# Patient Record
Sex: Female | Born: 1950 | Race: White | Hispanic: No | Smoking: Current every day smoker
Health system: Southern US, Community
[De-identification: ages and names within clinical notes are randomized; demographics above are authoritative.]

## PROBLEM LIST (undated history)

## (undated) DIAGNOSIS — E7800 Pure hypercholesterolemia, unspecified: Secondary | ICD-10-CM

## (undated) DIAGNOSIS — J449 Chronic obstructive pulmonary disease, unspecified: Secondary | ICD-10-CM

## (undated) DIAGNOSIS — F329 Major depressive disorder, single episode, unspecified: Secondary | ICD-10-CM

## (undated) DIAGNOSIS — F32A Depression, unspecified: Secondary | ICD-10-CM

## (undated) DIAGNOSIS — F419 Anxiety disorder, unspecified: Secondary | ICD-10-CM

## (undated) DIAGNOSIS — R0602 Shortness of breath: Secondary | ICD-10-CM

## (undated) DIAGNOSIS — C4491 Basal cell carcinoma of skin, unspecified: Secondary | ICD-10-CM

## (undated) DIAGNOSIS — I10 Essential (primary) hypertension: Secondary | ICD-10-CM

## (undated) DIAGNOSIS — K219 Gastro-esophageal reflux disease without esophagitis: Secondary | ICD-10-CM

## (undated) HISTORY — PX: CHOLECYSTECTOMY: SHX55

## (undated) HISTORY — PX: TUBAL LIGATION: SHX77

## (undated) HISTORY — DX: Basal cell carcinoma of skin, unspecified: C44.91

## (undated) HISTORY — DX: Gastro-esophageal reflux disease without esophagitis: K21.9

## (undated) HISTORY — DX: Anxiety disorder, unspecified: F41.9

## (undated) HISTORY — PX: BACK SURGERY: SHX140

---

## 2009-07-25 HISTORY — PX: COLONOSCOPY: SHX174

## 2009-09-24 HISTORY — PX: OTHER SURGICAL HISTORY: SHX169

## 2012-04-11 ENCOUNTER — Emergency Department (HOSPITAL_COMMUNITY): Payer: Medicare PPO

## 2012-04-11 ENCOUNTER — Emergency Department (HOSPITAL_COMMUNITY)
Admission: EM | Admit: 2012-04-11 | Discharge: 2012-04-11 | Disposition: A | Discharge status: Home / Self Care | Payer: Medicare PPO | Attending: Emergency Medicine | Admitting: Emergency Medicine

## 2012-04-11 ENCOUNTER — Encounter (HOSPITAL_COMMUNITY): Payer: Self-pay

## 2012-04-11 DIAGNOSIS — F3289 Other specified depressive episodes: Secondary | ICD-10-CM | POA: Insufficient documentation

## 2012-04-11 DIAGNOSIS — R079 Chest pain, unspecified: Secondary | ICD-10-CM | POA: Insufficient documentation

## 2012-04-11 DIAGNOSIS — E7800 Pure hypercholesterolemia, unspecified: Secondary | ICD-10-CM | POA: Insufficient documentation

## 2012-04-11 DIAGNOSIS — R0602 Shortness of breath: Secondary | ICD-10-CM

## 2012-04-11 DIAGNOSIS — F17200 Nicotine dependence, unspecified, uncomplicated: Secondary | ICD-10-CM | POA: Insufficient documentation

## 2012-04-11 DIAGNOSIS — R05 Cough: Secondary | ICD-10-CM | POA: Insufficient documentation

## 2012-04-11 DIAGNOSIS — I10 Essential (primary) hypertension: Secondary | ICD-10-CM | POA: Insufficient documentation

## 2012-04-11 DIAGNOSIS — R059 Cough, unspecified: Secondary | ICD-10-CM | POA: Insufficient documentation

## 2012-04-11 DIAGNOSIS — F329 Major depressive disorder, single episode, unspecified: Secondary | ICD-10-CM | POA: Insufficient documentation

## 2012-04-11 HISTORY — DX: Major depressive disorder, single episode, unspecified: F32.9

## 2012-04-11 HISTORY — DX: Depression, unspecified: F32.A

## 2012-04-11 HISTORY — DX: Pure hypercholesterolemia, unspecified: E78.00

## 2012-04-11 HISTORY — DX: Essential (primary) hypertension: I10

## 2012-04-11 LAB — BASIC METABOLIC PANEL
BUN: 9 mg/dL (ref 6–23)
CO2: 23 mEq/L (ref 19–32)
Chloride: 105 mEq/L (ref 96–112)
GFR calc Af Amer: 75 mL/min — ABNORMAL LOW (ref >90)
Glucose, Bld: 79 mg/dL (ref 70–99)
Potassium: 4.4 mEq/L (ref 3.5–5.1)

## 2012-04-11 LAB — DIFFERENTIAL
Basophils Absolute: 0.1 K/uL (ref 0.0–0.1)
Basophils Relative: 1 % (ref 0–1)
Eosinophils Absolute: 0.2 K/uL (ref 0.0–0.7)
Eosinophils Relative: 2 % (ref 0–5)
Lymphocytes Relative: 13 % (ref 12–46)
Lymphs Abs: 0.8 K/uL (ref 0.7–4.0)
Monocytes Absolute: 0.5 K/uL (ref 0.1–1.0)
Monocytes Relative: 8 % (ref 3–12)
Neutro Abs: 4.8 K/uL (ref 1.7–7.7)
Neutrophils Relative %: 76 % (ref 43–77)

## 2012-04-11 LAB — CBC
HCT: 38.9 % (ref 36.0–46.0)
Hemoglobin: 12.1 g/dL (ref 12.0–15.0)
RBC: 4.4 MIL/uL (ref 3.87–5.11)

## 2012-04-11 LAB — TROPONIN I: Troponin I: <0.3 ng/mL

## 2012-04-11 MED ORDER — METHYLPREDNISOLONE SODIUM SUCC 125 MG IJ SOLR
125.0000 mg | Freq: Once | INTRAMUSCULAR | Status: AC
  Administered 2012-04-11: 125 mg via INTRAVENOUS
  Filled 2012-04-11: qty 2

## 2012-04-11 MED ORDER — ALBUTEROL SULFATE (5 MG/ML) 0.5% IN NEBU
2.5000 mg | INHALATION_SOLUTION | Freq: Once | RESPIRATORY_TRACT | Status: AC
  Administered 2012-04-11: 2.5 mg via RESPIRATORY_TRACT
  Filled 2012-04-11: qty 0.5

## 2012-04-11 MED ORDER — ALBUTEROL SULFATE HFA 108 (90 BASE) MCG/ACT IN AERS: 1.0000 | INHALATION_SPRAY | Freq: Four times a day (QID) | RESPIRATORY_TRACT | Status: DC | PRN

## 2012-04-11 MED ORDER — PREDNISONE 10 MG PO TABS: 20.0000 mg | ORAL_TABLET | Freq: Every day | ORAL | Status: AC

## 2012-04-11 NOTE — ED Notes (Signed)
Pt ambulated in hallway with portable pulse ox. O2 sats 95%-97% room air, hr-80's. Denies sob/cp/dizziness/n/v. Gait steady. Nad noted.

## 2012-04-11 NOTE — ED Notes (Signed)
Pt was given asa 324 mg asa and ntg 1, sl at PCP's office

## 2012-04-11 NOTE — Discharge Instructions (Signed)
Your blood work including your heart numbers were normal. Your EKG and chest xray were normal. The most likely cause of both your shortness of breath and chest pain is mild congestion in your lungs. Use the inahler 4 times a day for the next 4 days then as needed for wheezing. Take all of the prednisone. Follow up with your doctor.

## 2012-04-11 NOTE — ED Notes (Signed)
Pt went to her PCP today for eval. States while there she began experiencing chest pain. States she had been having chest pain off and on for a week. Denies pain at present.

## 2012-04-11 NOTE — ED Provider Notes (Addendum)
History     CSN: [id number]  Arrival date & time 04/11/12  1237   First MD Initiated Contact with Patient 04/11/12 1305      Chief Complaint  Patient presents with  . Chest Pain    (Consider location/radiation/quality/duration/timing/severity/associated sxs/prior treatment) HPI Cynthia Munoz is a 61 y.o. female who presents to the Emergency Department complaining of shortness of breath associated with chest pain for one week. It is worse with ambulation. Denies fever, chills, nausea vomiting, diarrhea. Denies extremity swelling.    Past Medical History  Diagnosis Date  . Hypertension   . High cholesterol   . Depression     Past Surgical History  Procedure Date  . Cholecystectomy     No family history on file.  History  Substance Use Topics  . Smoking status: Current Everyday Smoker  . Smokeless tobacco: Not on file  . Alcohol Use: No    OB History    Grav Para Term Preterm Abortions TAB SAB Ect Mult Living                  Review of Systems  Constitutional: Negative for fever.       10 Systems reviewed and are negative for acute change except as noted in the HPI.  HENT: Negative for congestion.   Eyes: Negative for discharge and redness.  Respiratory: Positive for cough and shortness of breath.   Cardiovascular: Positive for chest pain.  Gastrointestinal: Negative for vomiting and abdominal pain.  Musculoskeletal: Negative for back pain.  Skin: Negative for rash.  Neurological: Negative for syncope, numbness and headaches.  Psychiatric/Behavioral:       No behavior change.   . Allergies  Review of patient's allergies indicates no known allergies.  Home Medications   Current Outpatient Rx  Name Route Sig Dispense Refill  . ALBUTEROL SULFATE HFA 108 (90 BASE) MCG/ACT IN AERS Inhalation Inhale 2 puffs into the lungs every 6 (six) hours as needed. Shortness of breath    . ASPIRIN EC 81 MG PO TBEC Oral Take 81 mg by mouth daily.    . FENOFIBRATE 145  MG PO TABS Oral Take 145 mg by mouth at bedtime.    Marland Kitchen FLUTICASONE PROPIONATE 50 MCG/ACT NA SUSP Nasal Place 1 spray into the nose daily.    Marland Kitchen LISINOPRIL 20 MG PO TABS Oral Take 40 mg by mouth daily.    Marland Kitchen NIACIN 500 MG PO TABS Oral Take 500 mg by mouth 2 (two) times daily.    Marland Kitchen OMEPRAZOLE 20 MG PO CPDR Oral Take 20 mg by mouth daily.    Marland Kitchen POLYETHYLENE GLYCOL 3350 PO POWD Oral Take 17 g by mouth daily.    Marland Kitchen PRAVASTATIN SODIUM 40 MG PO TABS Oral Take 80 mg by mouth at bedtime.    . SERTRALINE HCL 100 MG PO TABS Oral Take 200 mg by mouth daily.    Marland Kitchen ZOLPIDEM TARTRATE 5 MG PO TABS Oral Take 5 mg by mouth at bedtime.      BP 151/65  Pulse 81  Temp(Src) 98.5 F (36.9 C) (Oral)  Resp 20  SpO2 97%  Physical Exam  Nursing note and vitals reviewed. Constitutional:       Awake, alert, nontoxic appearance.  HENT:  Head: Atraumatic.  Eyes: Right eye exhibits no discharge. Left eye exhibits no discharge.  Neck: Neck supple.  Cardiovascular: Normal rate, regular rhythm, normal heart sounds and intact distal pulses.   Pulmonary/Chest: Effort normal. She exhibits no tenderness.  Abdominal: Soft. There is no tenderness. There is no rebound.  Musculoskeletal: She exhibits no edema and no tenderness.       Baseline ROM, no obvious new focal weakness.  Neurological:       Mental status and motor strength appears baseline for patient and situation.  Skin: No rash noted.  Psychiatric: She has a normal mood and affect.    ED Course  Procedures (including critical care time)  Results for orders placed during the hospital encounter of 04/11/12  CBC      Component Value Range   WBC 6.3  4.0 - 10.5 (K/uL)   RBC 4.40  3.87 - 5.11 (MIL/uL)   Hemoglobin 12.1  12.0 - 15.0 (g/dL)   HCT 78.2  95.6 - 21.3 (%)   MCV 88.4  78.0 - 100.0 (fL)   MCH 27.5  26.0 - 34.0 (pg)   MCHC 31.1  30.0 - 36.0 (g/dL)   RDW 08.6  57.8 - 46.9 (%)   Platelets 163  150 - 400 (K/uL)  DIFFERENTIAL      Component Value Range    Neutrophils Relative 76  43 - 77 (%)   Neutro Abs 4.8  1.7 - 7.7 (K/uL)   Lymphocytes Relative 13  12 - 46 (%)   Lymphs Abs 0.8  0.7 - 4.0 (K/uL)   Monocytes Relative 8  3 - 12 (%)   Monocytes Absolute 0.5  0.1 - 1.0 (K/uL)   Eosinophils Relative 2  0 - 5 (%)   Eosinophils Absolute 0.2  0.0 - 0.7 (K/uL)   Basophils Relative 1  0 - 1 (%)   Basophils Absolute 0.1  0.0 - 0.1 (K/uL)  BASIC METABOLIC PANEL      Component Value Range   Sodium 138  135 - 145 (mEq/L)   Potassium 4.4  3.5 - 5.1 (mEq/L)   Chloride 105  96 - 112 (mEq/L)   CO2 23  19 - 32 (mEq/L)   Glucose, Bld 79  70 - 99 (mg/dL)   BUN 9  6 - 23 (mg/dL)   Creatinine, Ser 6.29  0.50 - 1.10 (mg/dL)   Calcium 52.8 (*) 8.4 - 10.5 (mg/dL)   GFR calc non Af Amer 64 (*) >90 (mL/min)   GFR calc Af Amer 75 (*) >90 (mL/min)  TROPONIN I      Component Value Range   Troponin I <0.30  <0.30 (ng/mL)   Dg Chest Portable 1 View  04/11/2012  *RADIOLOGY REPORT*  Clinical Data: Chest pain and shortness of breath.  PORTABLE CHEST - 1 VIEW  Comparison: 09/09/2008.  Findings: Trachea is midline.  Heart size normal.  Linear densities at the lung bases are accentuated by somewhat low lung volumes and were present on 09/09/2008, indicating scarring.  Lungs are otherwise clear.  No pleural fluid.  IMPRESSION: No acute findings.  Original Report Authenticated By: Reyes Ivan, M.D.    Date: 04/11/2012  1254  Rate: 74  Rhythm: normal sinus rhythm  QRS Axis: normal  Intervals: normal  ST/T Wave abnormalities: normal  Conduction Disutrbances:none  Narrative Interpretation:   Old EKG Reviewed: none available     MDM  Patient with SOB and chest pain x 1 week. Received nebulizer treatment with improvement. Labs unremarkable. Troponin and EKG normal. Chest xray normal. Patient ambulated in the hall with O2 sats remaining between 95 and 90% and in no distress. She is taking by mouth fluids, have a snack. Review of laboratory data with the  patient and her family.Pt stable in ED with no significant deterioration in condition.The patient appears reasonably screened and/or stabilized for discharge and I doubt any other medical condition or other [facility] requiring further screening, evaluation, or treatment in the ED at this time prior to discharge.  MDM Reviewed: nursing note and vitals Interpretation: labs, ECG and x-ray           Nicoletta Dress. Colon Branch, MD 04/11/12 1533  Nicoletta Dress. Colon Branch, MD 04/11/12 [id number]

## 2012-07-25 HISTORY — PX: ESOPHAGOGASTRODUODENOSCOPY: SHX1529

## 2013-06-04 ENCOUNTER — Encounter: Payer: Self-pay | Admitting: Gastroenterology

## 2013-06-04 ENCOUNTER — Ambulatory Visit (INDEPENDENT_AMBULATORY_CARE_PROVIDER_SITE_OTHER): Payer: Medicare PPO | Admitting: Gastroenterology

## 2013-06-04 VITALS — BP 145/74 | HR 87 | Temp 98.0°F | Ht 63.0 in | Wt 142.8 lb

## 2013-06-04 DIAGNOSIS — R1011 Right upper quadrant pain: Secondary | ICD-10-CM

## 2013-06-04 DIAGNOSIS — R945 Abnormal results of liver function studies: Secondary | ICD-10-CM | POA: Insufficient documentation

## 2013-06-04 DIAGNOSIS — R1013 Epigastric pain: Secondary | ICD-10-CM | POA: Insufficient documentation

## 2013-06-04 DIAGNOSIS — R7989 Other specified abnormal findings of blood chemistry: Secondary | ICD-10-CM

## 2013-06-04 DIAGNOSIS — D509 Iron deficiency anemia, unspecified: Secondary | ICD-10-CM

## 2013-06-04 NOTE — Progress Notes (Signed)
Primary Care Physician:  Alleen Borne  Primary Gastroenterologist:  Jonette Eva, MD   Chief Complaint  Patient presents with  . Abdominal Pain    HPI:  Cynthia Munoz is a 62 y.o. female here for further evaluation of RUQ/epigastric pain. Symptoms have been present for over one month. She has h/o EGD last year with ulcers noted by Dr. Jennette Dubin in [place]. Symptoms may last couple of days. Not worse with movement or sitting. Worse with eating. Episode Sunday after Meatloaf. Some vomiting, no hematemesis. No heartburn. No dysphagia. BM regular. No melena, brbpr. Weak and tired. Takes Voltaren chronically. No OTC NSAIDs. Labs as below. Patient reports last TCS by Dr. Aleene Davidson few years ago. C/O 8 pound weight loss.  Current Outpatient Prescriptions  Medication Sig Dispense Refill  . albuterol (PROVENTIL HFA;VENTOLIN HFA) 108 (90 BASE) MCG/ACT inhaler Inhale 2 puffs into the lungs every 6 (six) hours as needed. Shortness of breath      . aspirin EC 81 MG tablet Take 81 mg by mouth daily.      . diclofenac (VOLTAREN) 75 MG EC tablet Take 75 mg by mouth 2 (two) times daily.       . fenofibrate (TRICOR) 145 MG tablet Take 145 mg by mouth daily.       . fluticasone (FLONASE) 50 MCG/ACT nasal spray Place 1 spray into the nose daily.      Marland Kitchen lisinopril (PRINIVIL,ZESTRIL) 20 MG tablet Take 40 mg by mouth daily.      . niacin 500 MG tablet Take 500 mg by mouth 2 (two) times daily.      . pantoprazole (PROTONIX) 40 MG tablet Take 40 mg by mouth daily.       . pravastatin (PRAVACHOL) 40 MG tablet Take 80 mg by mouth at bedtime.      . sertraline (ZOLOFT) 100 MG tablet Take 200 mg by mouth daily.       No current facility-administered medications for this visit.    Allergies as of 06/04/2013  . (No Known Allergies)    Past Medical History  Diagnosis Date  . Hypertension   . High cholesterol   . Depression   . Anxiety   . Skin cancer, basal cell   . GERD (gastroesophageal reflux  disease)     Past Surgical History  Procedure Laterality Date  . Cholecystectomy    . Back surgery      Family History  Problem Relation Age of Onset  . Colon cancer Paternal Aunt   . Lung cancer Father   . Heart disease Father     History   Social History  . Marital Status: Legally Separated    Spouse Name: N/A    Number of Children: N/A  . Years of Education: N/A   Occupational History  . Not on file.   Social History Main Topics  . Smoking status: Current Every Day Smoker -- 1.50 packs/day    Types: Cigarettes  . Smokeless tobacco: Not on file  . Alcohol Use: No  . Drug Use: No  . Sexually Active: Not on file   Other Topics Concern  . Not on file   Social History Narrative  . No narrative on file      ROS:  General: Negative for anorexia, weight loss, fever, chills. + fatigue, weakness. Eyes: Negative for vision changes.  ENT: Negative for hoarseness, difficulty swallowing , nasal congestion. CV: Negative for chest pain, angina, palpitations, dyspnea on exertion, peripheral edema.  Respiratory:  Negative for dyspnea at rest, dyspnea on exertion, cough, sputum, wheezing.  GI: See history of present illness. GU:  Negative for dysuria, hematuria, urinary incontinence, urinary frequency, nocturnal urination.  MS: + for joint pain, low back pain.  Derm: Negative for rash or itching.  Neuro: Negative for weakness, abnormal sensation, seizure, frequent headaches, memory loss, confusion.  Psych: Negative for anxiety, depression, suicidal ideation, hallucinations.  Endo: Negative for unusual weight change.  Heme: Negative for bruising or bleeding. Allergy: Negative for rash or hives.    Physical Examination:  BP 145/74  Pulse 87  Temp(Src) 98 F (36.7 C) (Oral)  Ht 5\' 3"  (1.6 m)  Wt 142 lb 12.8 oz (64.774 kg)  BMI 25.3 kg/m2   General: Well-nourished, well-developed in no acute distress. Accompanied by daughter.  Head: Normocephalic, atraumatic.    Eyes: Conjunctiva pink, no icterus. Mouth: Oropharyngeal mucosa moist and pink , no lesions erythema or exudate. Neck: Supple without thyromegaly, masses, or lymphadenopathy.  Lungs: Clear to auscultation bilaterally.  Heart: Regular rate and rhythm, no murmurs rubs or gallops.  Abdomen: Bowel sounds are normal, epigastric/ruq tenderness with deep palpation. Tender along right lower ribcage. No cva tenderness or rash. Abd nondistended, no hepatosplenomegaly or masses, no abdominal bruits or    hernia , no rebound or guarding.   Rectal: not performed Extremities: No lower extremity edema. No clubbing or deformities.  Neuro: Alert and oriented x 4 , grossly normal neurologically.  Skin: Warm and dry, no rash or jaundice.   Psych: Alert and cooperative, normal mood and affect.  Labs: Labs from 05/30/2013  White blood cell count 7800, hemoglobin 11.1, hematocrit 34.2, MCV 73, platelet count 225,000, sodium 141, potassium 4.1, calcium-ionized 5.9, glucose 87, BUN 12, creatinine 0.9, H. pylori antibody negative, total bilirubin 0.4, alkaline phosphatase 112, AST 46, ALT 14, albumin 3.5.  Imaging Studies: No results found.

## 2013-06-04 NOTE — Patient Instructions (Addendum)
1. Once we receive your records from your prior upper endoscopy and colonoscopy, we will make further recommendations. 2. Continue pantoprazole for now. 3. Please have your blood work done in one month. 4. Please collect stool specimen and return to our office. 5. Please follow diet below given your history of ulcers and ongoing abdominal pain.  Diet for Peptic Ulcer Disease Peptic ulcer disease is a term used to describe open sores in the stomach and digestive tract. These sores can be painful, and the pain can be worse when the stomach is empty. These ulcers can lead to bleeding in the esophagus, stomach, and intestines (gastrointestinaltract). Nutrition therapy can help ease the discomfort of peptic ulcer disease.   GENERAL DIET GUIDELINES FOR PEPTIC ULCER DISEASE  Your diet should be rich in fruits, vegetables, and whole grains. It should also be low in fat.   Avoid foods that can irritate your sores.   Avoid foods that are not tolerated or foods that cause pain. This may be different for different people. FOODS AND DRINKS TO AVOID  High-fat dairy and milk products including whole milk, cream, chocolate milk, butter, sour cream, or cream cheese.   High-fat meats and poultry including hot dogs, fried meats or poultry, meats with skin, sausage, or bacon.   Pepper.  Alcohol.  Chocolate.  Tea, coffee, and cola (regular and caffeine).  Lard or hydrogenated oils such as stick margarine.  SAMPLE MEAL PLAN This meal plan is approximately 2000 calories based on https://www.[facility].org/ meal planning guidelines.  Breakfast   cup cooked oatmeal.  1 cup strawberries.  1 cup low-fat milk.  1 oz almonds. Snack  1 cup cucumber slices.  6 oz yogurt (made from low-fat or fat-free milk). Lunch  2 slices whole-wheat bread.  2 oz sliced [place].  2 tsp mayonnaise.  1 cup blueberries.  1 cup snap peas. Snack  6 whole-wheat crackers.  1 oz string cheese. Dinner   cup  brown rice.  1 cup mixed veggies.  1 tsp olive oil.  3 oz grilled fish. Document Released: 03/04/2012 Document Reviewed: 03/04/2012 [facility] Patient Information 2014 [place], [place].

## 2013-06-05 NOTE — Assessment & Plan Note (Signed)
Minimal elevation of AST. Repeat in one month. If remains elevated, consider abd u/s.

## 2013-06-05 NOTE — Assessment & Plan Note (Signed)
62 y/o female with prior h/o PUD last year, chronic NSAID use who presents with one month h/o epig/ruq pain worse with meals. She has mild microcytic anemia possibly from occult GI blood loss. Prior EGD/TCS as above. Records have been requested. Plan for CBC, iron, ferritin, LFTs in one month. Check ifobt now. Likely EGD in near future.

## 2013-06-05 NOTE — Assessment & Plan Note (Signed)
CBC, iron, ferritin in one month. Ifobt. Obtain copy of last TCS.

## 2013-06-06 NOTE — Progress Notes (Signed)
Cc PCP 

## 2013-06-09 ENCOUNTER — Ambulatory Visit (INDEPENDENT_AMBULATORY_CARE_PROVIDER_SITE_OTHER): Payer: Medicare PPO | Admitting: Gastroenterology

## 2013-06-09 DIAGNOSIS — R1013 Epigastric pain: Secondary | ICD-10-CM

## 2013-06-09 LAB — CHG HEPATOBILIARY IMAGING
ALT: 14 U/L (ref 7–35)
AST: 46 U/L
Creat: 0.9
Potassium: 4.1 mmol/L

## 2013-06-09 LAB — IFOBT (OCCULT BLOOD): IFOBT: POSITIVE

## 2013-06-09 LAB — H. PYLORI ANTIGEN, STOOL: HELICOBACTER PYLORI AB, IGA: NEGATIVE

## 2013-06-16 NOTE — Progress Notes (Signed)
Quick Note:  Heme positive. Did we received records of prior EGD/TCS yet? ______

## 2013-06-16 NOTE — Progress Notes (Signed)
Quick Note:  Yes. Reports in your office. ______

## 2013-07-03 ENCOUNTER — Encounter: Payer: Self-pay | Admitting: Gastroenterology

## 2013-07-03 ENCOUNTER — Telehealth: Payer: Self-pay | Admitting: Gastroenterology

## 2013-07-03 NOTE — Telephone Encounter (Signed)
Please let patient know that I have reviewed her previous endoscopy records. Past surgical history has been updated.  Given her history of gastric ulcers, Hemoccult-positive stool, iron deficiency anemia she will definitely need an upper endoscopy. We will need to decide about colonoscopy.  At this time, I would advise her to have her repeat blood work as discussed at time of her last office visit.  Schedule office visit one to 2 days following the labs, at that time we can schedule EGD plus or minus colonoscopy as appropriate.

## 2013-07-04 NOTE — Telephone Encounter (Signed)
Routing to DS, this is a [facility] pt. 

## 2013-07-09 NOTE — Telephone Encounter (Signed)
Called and informed pt. She is scheduled OV with Tana Coast, PA on 07/29/2013 at 9:00 AM. She has lab orders and will do the labs.

## 2013-07-25 LAB — CBC WITH DIFFERENTIAL/PLATELET
Basophils Absolute: 0.1 10*3/uL (ref 0.0–0.1)
HCT: 38.1 % (ref 36.0–46.0)
Lymphocytes Relative: 13 % (ref 12–46)
Monocytes Absolute: 0.5 10*3/uL (ref 0.1–1.0)
Neutro Abs: 5.4 10*3/uL (ref 1.7–7.7)
RDW: 16.9 % — ABNORMAL HIGH (ref 11.5–15.5)
WBC: 7.2 10*3/uL (ref 4.0–10.5)

## 2013-07-25 LAB — HEPATIC FUNCTION PANEL
ALT: 11 U/L (ref 0–35)
AST: 28 U/L (ref 0–37)
Alkaline Phosphatase: 79 U/L (ref 39–117)
Bilirubin, Direct: 0.1 mg/dL (ref 0.0–0.3)
Indirect Bilirubin: 0.2 mg/dL (ref 0.0–0.9)

## 2013-07-25 LAB — IRON AND TIBC: UIBC: >575 ug/dL — ABNORMAL HIGH (ref 125–400)

## 2013-07-25 LAB — FERRITIN: Ferritin: 12 ng/mL (ref 10–291)

## 2013-07-28 NOTE — Progress Notes (Signed)
Quick Note:  LFTs normal. IDA.  OV tomorrow as planned. ______

## 2013-07-28 NOTE — Progress Notes (Signed)
Quick Note:  Called and informed pt. ______ 

## 2013-07-29 ENCOUNTER — Ambulatory Visit: Payer: Medicare PPO | Admitting: Gastroenterology

## 2013-07-29 ENCOUNTER — Encounter: Payer: Self-pay | Admitting: Gastroenterology

## 2013-07-29 ENCOUNTER — Ambulatory Visit (INDEPENDENT_AMBULATORY_CARE_PROVIDER_SITE_OTHER): Payer: Medicare PPO | Admitting: Gastroenterology

## 2013-07-29 VITALS — BP 145/87 | HR 87 | Temp 99.5°F | Ht 63.0 in | Wt 141.8 lb

## 2013-07-29 DIAGNOSIS — D509 Iron deficiency anemia, unspecified: Secondary | ICD-10-CM

## 2013-07-29 DIAGNOSIS — R1011 Right upper quadrant pain: Secondary | ICD-10-CM

## 2013-07-29 MED ORDER — PEG 3350-KCL-NA BICARB-NACL 420 G PO SOLR: 4000.0000 mL | ORAL | Status: DC

## 2013-07-29 MED ORDER — PANTOPRAZOLE SODIUM 40 MG PO TBEC: 40.0000 mg | DELAYED_RELEASE_TABLET | Freq: Two times a day (BID) | ORAL | Status: DC

## 2013-07-29 NOTE — Assessment & Plan Note (Signed)
RUQ pain and epigastric pain in the setting of known PUD. LFTs normal. However, with weight loss, consider CT scan if EGD unrevealing.

## 2013-07-29 NOTE — Patient Instructions (Addendum)
We have scheduled you for a colonoscopy and an upper endoscopy with Dr. Darrick Penna in the near future.  STOP ALL BC POWDERS. This causes ulcers and delays ulcer healing in your stomach.  I have increased your Protonix to twice a day.

## 2013-07-29 NOTE — Assessment & Plan Note (Signed)
62 year old female with history of PUD, noted on EGD in [place] Aug 2013, now with IDA and heme positive stool. Persistent epigastric and RUQ pain in the setting of Voltaren BID and BC powders daily. Likely culprit of persistent discomfort. LFTs normal. Vague early satiety with associated weight loss of at least 10 lbs. Last colonoscopy in 2010 with hyperplastic polyp by Dr. Aleene Davidson.   Needs TCS/EGD due to IDA with Dr. Darrick Penna. I discussed the risks and benefits in detail with patient, and she states understanding. Increase Protonix to BID. AVOID ALL ASPIRIN POWDERS.

## 2013-07-29 NOTE — Progress Notes (Signed)
Referring Provider: Rush Barer, PA-C Primary Care Physician:  Delorse Lek, FNP Primary GI: Dr. Darrick Penna   Chief Complaint  Patient presents with  . Follow-up  . EGD    HPI:   62 year old Munoz returns today in follow-up to discuss EGD and possible colonoscopy. Originally seen in June 2014 by our practice for epigastric and RUQ pain. History of PUD, documented by Dr. Aileen Fass in [place], [place] in Aug 2013. Likely secondary to chronic NSAIDs.  Surveillance EGD not performed. Last colonoscopy by Dr. Aleene Davidson in 2010 with hyperplastic polyp. Heme positive at last visit. IDA noted.   Notes RUQ discomfort, radiating to epigastric region. Worse with eating. Sometimes feels like she is going to choke while eating. Reports early satiety. Notes her baseline weight is 153, now 141. Voltaren BID. BC powders one a day. No melena. No bright red blood. Some constipation, alternating with diarrhea. Chronic history of constipation. Protonix daily.    Past Medical History  Diagnosis Date  . Hypertension   . High cholesterol   . Depression   . Anxiety   . Skin cancer, basal cell   . GERD (gastroesophageal reflux disease)     Past Surgical History  Procedure Laterality Date  . Cholecystectomy    . Back surgery    . Esophagogastroduodenoscopy  August 2013    Dr. Anette Riedel Hou--> protocol.c reflux esophagitis. Nonbleeding gastric ulcers. Hiatal hernia. Biopsies negative for H. pylori or Barrett's esophagus. Followup EGD recommended but not done.  . Colonoscopy  August 2010    Dr. Ree Kida Spainhour--> sessile polyp removed from the rectum into from the sigmoid colon. Pathology: Hyperplastic   . Small bowel capsule endoscopy  October 2010    Dr. Ree Kida Spainhour--> normal.     Current Outpatient Prescriptions  Medication Sig Dispense Refill  . albuterol (PROVENTIL HFA;VENTOLIN HFA) 108 (90 BASE) MCG/ACT inhaler Inhale 2 puffs into the lungs every 6 (six) hours as needed. Shortness of breath      . aspirin EC  81 MG tablet Take 81 mg by mouth daily.      . diclofenac (VOLTAREN) 75 MG EC tablet Take 75 mg by mouth 2 (two) times daily.       . fenofibrate (TRICOR) 145 MG tablet Take 145 mg by mouth daily.       . fluticasone (FLONASE) 50 MCG/ACT nasal spray Place 1 spray into the nose daily.      Marland Kitchen lisinopril (PRINIVIL,ZESTRIL) 20 MG tablet Take 40 mg by mouth daily.      . niacin 500 MG tablet Take 500 mg by mouth 2 (two) times daily.      . pantoprazole (PROTONIX) 40 MG tablet Take 1 tablet (40 mg total) by mouth 2 (two) times daily.  60 tablet  3  . pravastatin (PRAVACHOL) 40 MG tablet Take 80 mg by mouth at bedtime.      . sertraline (ZOLOFT) 100 MG tablet Take 200 mg by mouth daily.       No current facility-administered medications for this visit.    Allergies as of 07/29/2013  . (No Known Allergies)    Family History  Problem Relation Age of Onset  . Colon cancer Paternal Aunt     greater >60  . Lung cancer Father   . Heart disease Father   . Liver disease Neg Hx   . Celiac disease Neg Hx   . Diverticulitis Sister     History   Social History  . Marital Status: Legally Separated  Spouse Name: N/A    Number of Children: 2  . Years of Education: N/A   Social History Main Topics  . Smoking status: Current Every Day Smoker -- 1.50 packs/day    Types: Cigarettes  . Smokeless tobacco: None  . Alcohol Use: No  . Drug Use: No  . Sexually Active: None   Other Topics Concern  . None   Social History Narrative  . None    Review of Systems: Negative unless mentioned in HPI.  Physical Exam: BP 145/87  Pulse 87  Temp(Src) 99.5 F (37.5 C) (Oral)  Ht 5\' 3"  (1.6 m)  Wt 141 lb Cynthia.8 oz (64.32 kg)  BMI 25.13 kg/m2 General:   Alert and oriented. No distress noted. Pleasant and cooperative.  Head:  Normocephalic and atraumatic. Eyes:  Conjuctiva clear without scleral icterus. Mouth:  Oral mucosa pink and moist.  Heart:  S1, S2 present without murmurs, rubs, or gallops.  Regular rate and rhythm. Lungs: mild wheeze bilaterally Abdomen:  +BS, soft, non-tender epigastric region, mild TTP with deep palpation RUQ. non-distended. No rebound or guarding. No HSM or masses noted. Msk:  Symmetrical without gross deformities. Normal posture. Extremities:  Without edema. Neurologic:  Alert and  oriented x4;  grossly normal neurologically. Skin:  Intact without significant lesions or rashes. Psych:  Alert and cooperative. Normal mood and affect.  Lab Results  Component Value Date   WBC 7.2 07/24/2013   HGB 11.6* 07/24/2013   HCT 38.1 07/24/2013   MCV 72.4* 07/24/2013   PLT 211 07/24/2013   Lab Results  Component Value Date   IRON 17* 07/24/2013   TIBC NOT CALC 07/24/2013   FERRITIN Cynthia 07/24/2013   Lab Results  Component Value Date   ALT 11 07/24/2013   AST 28 07/24/2013   ALKPHOS 79 07/24/2013   BILITOT 0.3 07/24/2013

## 2013-07-30 ENCOUNTER — Encounter (HOSPITAL_COMMUNITY): Payer: Self-pay | Admitting: Pharmacy Technician

## 2013-08-04 NOTE — Progress Notes (Signed)
CC'd to PCP 

## 2013-08-11 ENCOUNTER — Encounter (HOSPITAL_COMMUNITY)
Admission: RE | Disposition: A | Discharge status: Home / Self Care | Payer: Self-pay | Source: Ambulatory Visit | Attending: Gastroenterology

## 2013-08-11 ENCOUNTER — Encounter (HOSPITAL_COMMUNITY): Payer: Self-pay | Admitting: *Deleted

## 2013-08-11 ENCOUNTER — Ambulatory Visit (HOSPITAL_COMMUNITY)
Admission: RE | Admit: 2013-08-11 | Discharge: 2013-08-11 | Disposition: A | Discharge status: Home / Self Care | Payer: Medicare PPO | Source: Ambulatory Visit | Attending: Gastroenterology | Admitting: Gastroenterology

## 2013-08-11 DIAGNOSIS — K5730 Diverticulosis of large intestine without perforation or abscess without bleeding: Secondary | ICD-10-CM | POA: Insufficient documentation

## 2013-08-11 DIAGNOSIS — R1011 Right upper quadrant pain: Secondary | ICD-10-CM

## 2013-08-11 DIAGNOSIS — D509 Iron deficiency anemia, unspecified: Secondary | ICD-10-CM

## 2013-08-11 DIAGNOSIS — F411 Generalized anxiety disorder: Secondary | ICD-10-CM | POA: Insufficient documentation

## 2013-08-11 DIAGNOSIS — Z791 Long term (current) use of non-steroidal anti-inflammatories (NSAID): Secondary | ICD-10-CM | POA: Insufficient documentation

## 2013-08-11 DIAGNOSIS — F17200 Nicotine dependence, unspecified, uncomplicated: Secondary | ICD-10-CM | POA: Insufficient documentation

## 2013-08-11 DIAGNOSIS — K648 Other hemorrhoids: Secondary | ICD-10-CM | POA: Insufficient documentation

## 2013-08-11 DIAGNOSIS — R634 Abnormal weight loss: Secondary | ICD-10-CM | POA: Insufficient documentation

## 2013-08-11 DIAGNOSIS — R197 Diarrhea, unspecified: Secondary | ICD-10-CM

## 2013-08-11 DIAGNOSIS — I10 Essential (primary) hypertension: Secondary | ICD-10-CM | POA: Insufficient documentation

## 2013-08-11 DIAGNOSIS — Z85828 Personal history of other malignant neoplasm of skin: Secondary | ICD-10-CM | POA: Insufficient documentation

## 2013-08-11 DIAGNOSIS — Z7982 Long term (current) use of aspirin: Secondary | ICD-10-CM | POA: Insufficient documentation

## 2013-08-11 DIAGNOSIS — Z79899 Other long term (current) drug therapy: Secondary | ICD-10-CM | POA: Insufficient documentation

## 2013-08-11 DIAGNOSIS — D649 Anemia, unspecified: Secondary | ICD-10-CM

## 2013-08-11 DIAGNOSIS — K259 Gastric ulcer, unspecified as acute or chronic, without hemorrhage or perforation: Secondary | ICD-10-CM | POA: Insufficient documentation

## 2013-08-11 DIAGNOSIS — E7800 Pure hypercholesterolemia, unspecified: Secondary | ICD-10-CM | POA: Insufficient documentation

## 2013-08-11 DIAGNOSIS — K219 Gastro-esophageal reflux disease without esophagitis: Secondary | ICD-10-CM | POA: Insufficient documentation

## 2013-08-11 DIAGNOSIS — Z8711 Personal history of peptic ulcer disease: Secondary | ICD-10-CM | POA: Insufficient documentation

## 2013-08-11 HISTORY — PX: COLONOSCOPY WITH ESOPHAGOGASTRODUODENOSCOPY (EGD): SHX5779

## 2013-08-11 SURGERY — COLONOSCOPY WITH ESOPHAGOGASTRODUODENOSCOPY (EGD)
Anesthesia: Moderate Sedation

## 2013-08-11 MED ORDER — MEPERIDINE HCL 100 MG/ML IJ SOLN
INTRAMUSCULAR | Status: AC
  Filled 2013-08-11: qty 2

## 2013-08-11 MED ORDER — STERILE WATER FOR IRRIGATION IR SOLN
Status: DC | PRN
  Administered 2013-08-11: 12:00:00

## 2013-08-11 MED ORDER — BUTAMBEN-TETRACAINE-BENZOCAINE 2-2-14 % EX AERO
INHALATION_SPRAY | CUTANEOUS | Status: DC | PRN
  Administered 2013-08-11: 1 via TOPICAL

## 2013-08-11 MED ORDER — SODIUM CHLORIDE 0.9 % IV SOLN
INTRAVENOUS | Status: DC
  Administered 2013-08-11: 11:00:00 via INTRAVENOUS

## 2013-08-11 MED ORDER — MEPERIDINE HCL 100 MG/ML IJ SOLN
INTRAMUSCULAR | Status: DC | PRN
  Administered 2013-08-11: 50 mg via INTRAVENOUS
  Administered 2013-08-11: 25 mg via INTRAVENOUS

## 2013-08-11 MED ORDER — PROMETHAZINE HCL 25 MG/ML IJ SOLN
INTRAMUSCULAR | Status: AC
  Filled 2013-08-11: qty 1

## 2013-08-11 MED ORDER — SODIUM CHLORIDE 0.9 % IJ SOLN
INTRAMUSCULAR | Status: AC
  Filled 2013-08-11: qty 10

## 2013-08-11 MED ORDER — MIDAZOLAM HCL 5 MG/5ML IJ SOLN
INTRAMUSCULAR | Status: AC
  Filled 2013-08-11: qty 10

## 2013-08-11 MED ORDER — ONDANSETRON HCL 4 MG/2ML IJ SOLN: INTRAMUSCULAR | Status: DC | PRN

## 2013-08-11 MED ORDER — PROMETHAZINE HCL 25 MG/ML IJ SOLN
12.5000 mg | Freq: Once | INTRAMUSCULAR | Status: AC
  Administered 2013-08-11: 12.5 mg via INTRAVENOUS

## 2013-08-11 MED ORDER — MIDAZOLAM HCL 5 MG/5ML IJ SOLN
INTRAMUSCULAR | Status: DC | PRN
  Administered 2013-08-11 (×3): 2 mg via INTRAVENOUS

## 2013-08-11 NOTE — H&P (Signed)
Primary Care Physician:  Delorse Lek, FNP Primary Gastroenterologist:  Dr. Darrick Penna  Pre-Procedure History & Physical: HPI:  Cynthia Munoz is a 62 y.o. female here for Anemia/weight loss/PUD AUG 2013.  Past Medical History  Diagnosis Date  . Hypertension   . High cholesterol   . Depression   . Anxiety   . Skin cancer, basal cell   . GERD (gastroesophageal reflux disease)     Past Surgical History  Procedure Laterality Date  . Cholecystectomy    . Back surgery    . Esophagogastroduodenoscopy  August 2013    Dr. Anette Riedel Hou--> protocol.c reflux esophagitis. Nonbleeding gastric ulcers. Hiatal hernia. Biopsies negative for H. pylori or Barrett's esophagus. Followup EGD recommended but not done.  . Colonoscopy  August 2010    Dr. Ree Kida Spainhour--> sessile polyp removed from the rectum into from the sigmoid colon. Pathology: Hyperplastic   . Small bowel capsule endoscopy  October 2010    Dr. Ree Kida Spainhour--> normal.     Prior to Admission medications   Medication Sig Start Date End Date Taking? Authorizing Provider  albuterol (PROVENTIL HFA;VENTOLIN HFA) 108 (90 BASE) MCG/ACT inhaler Inhale 2 puffs into the lungs every 6 (six) hours as needed. Shortness of breath   Yes Historical Provider, MD  aspirin EC 81 MG tablet Take 81 mg by mouth daily.   Yes Historical Provider, MD  diclofenac (VOLTAREN) 75 MG EC tablet Take 75 mg by mouth 2 (two) times daily.  05/30/13  Yes Historical Provider, MD  fenofibrate (TRICOR) 145 MG tablet Take 145 mg by mouth daily.    Yes Historical Provider, MD  lisinopril (PRINIVIL,ZESTRIL) 20 MG tablet Take 40 mg by mouth daily.   Yes Historical Provider, MD  NASONEX 50 MCG/ACT nasal spray Place 2 sprays into the nose daily. 06/05/13  Yes Historical Provider, MD  niacin 500 MG tablet Take 500 mg by mouth 2 (two) times daily.   Yes Historical Provider, MD  pantoprazole (PROTONIX) 40 MG tablet Take 1 tablet (40 mg total) by mouth 2 (two) times daily. 07/29/13  Yes  Nira Retort, NP  polyethylene glycol-electrolytes (TRILYTE) 420 G solution Take 4,000 mLs by mouth as directed. 07/29/13  Yes West Bali, MD  pravastatin (PRAVACHOL) 40 MG tablet Take 80 mg by mouth at bedtime.   Yes Historical Provider, MD  sertraline (ZOLOFT) 100 MG tablet Take 200 mg by mouth daily.   Yes Historical Provider, MD    Allergies as of 07/29/2013  . (No Known Allergies)    Family History  Problem Relation Age of Onset  . Colon cancer Paternal Aunt     greater >60  . Lung cancer Father   . Heart disease Father   . Liver disease Neg Hx   . Celiac disease Neg Hx   . Diverticulitis Sister     History   Social History  . Marital Status: Legally Separated    Spouse Name: N/A    Number of Children: 2  . Years of Education: N/A   Occupational History  . Not on file.   Social History Main Topics  . Smoking status: Current Every Day Smoker -- 1.50 packs/day    Types: Cigarettes  . Smokeless tobacco: Not on file  . Alcohol Use: No  . Drug Use: No  . Sexual Activity: Not on file   Other Topics Concern  . Not on file   Social History Narrative  . No narrative on file    Review of  Systems: See HPI, otherwise negative ROS   Physical Exam: BP 153/78  Pulse 90  Temp(Src) 98.3 F (36.8 C) (Oral)  Resp 22  Ht 5\' 3"  (1.6 m)  Wt 141 lb (63.957 kg)  BMI 24.98 kg/m2  SpO2 97% General:   Alert,  pleasant and cooperative in NAD Head:  Normocephalic and atraumatic. Neck:  Supple; Lungs:  Clear throughout to auscultation.    Heart:  Regular rate and rhythm. Abdomen:  Soft, nontender and nondistended. Normal bowel sounds, without guarding, and without rebound.   Neurologic:  Alert and  oriented x4;  grossly normal neurologically.  Impression/Plan:     Anemia/weight loss/PUD AUG 2013   PLAN: EGD/TCS TODAY

## 2013-08-11 NOTE — Op Note (Signed)
[facility] [address] [place], [address]   COLONOSCOPY PROCEDURE REPORT  PATIENT: Cynthia, Munoz  MR#: [id number] BIRTHDATE: 1951/02/06 , 62  yrs. old GENDER: Female ENDOSCOPIST: Jonette Eva, MD REFERRED UY:[id number] Carlena Sax, FNP PROCEDURE DATE:  08/11/2013 PROCEDURE:   Colonoscopy, diagnostic INDICATIONS:anemia, weight loss/RUQ ABD APAIN, PMHx: 2013 PUD-CONTINUES TO USE BC POWDER/VOLTAREN/ASA. MEDICATIONS: Demerol 75 mg IV, Versed 5 mg IV, and PREOP: Promethazine (Phenergan) 12.5mg  IV  DESCRIPTION OF PROCEDURE:    Physical exam was performed.  Informed consent was obtained from the patient after explaining the benefits, risks, and alternatives to procedure.  The patient was connected to monitor and placed in left lateral position. Continuous oxygen was provided by nasal cannula and IV medicine administered through an indwelling cannula.  After administration of sedation and rectal exam, the patients rectum was intubated and the EC-3490TLi ([id number])  colonoscope was advanced under direct visualization to the ileum.  The scope was removed slowly by carefully examining the color, texture, anatomy, and integrity mucosa on the way out.  The patient was recovered in endoscopy and discharged home in satisfactory condition.  images  COLON FINDINGS: The mucosa appeared normal in the terminal ileum.  , Mild diverticulosis was noted in the sigmoid colon.  , and Moderate sized internal hemorrhoids were found.  PREP QUALITY: good.   CECAL W/D TIME: 17 minutes     COMPLICATIONS: None  ENDOSCOPIC IMPRESSION: 1.   Normal mucosa in the terminal ileum 2.   Mild diverticulosis was noted in the sigmoid colon 3.   Moderate sized internal hemorrhoids  RECOMMENDATIONS: CONTINUE PROTONIX.  TAKE 30 MINUTES PRIOR TO MEALS TWICE DAILY. STRICTLY AVOID ASPIRIN, BC/GOODY POWDERS, IBUPROFEN/MOTRIN, OR NAPROXEN/ALEVE FOR 2 WEEKS. MAY RESTART ASA/NSAIDS IF MEDICALLY  NCESSARY AVOID ITEMS THAT TRIGGER ULCERS. FOLLOW A HIGH FIBER.  AVOID ITEMS THAT CAUSE BLOATING.  REPEAT EGD IN 3 MOS. FOLLOW UP IN 4 MOS.     [Instructions Given]  _______________________________ Rosalie DoctorJonette Eva, MD 08/11/2013 3:38 PM revised

## 2013-08-11 NOTE — Op Note (Signed)
[facility] [address] [place], [address]   ENDOSCOPY PROCEDURE REPORT  PATIENT: Dorethea, Strubel  MR#: [id number] BIRTHDATE: [birth year] , 62  yrs. old GENDER: Female  ENDOSCOPIST: Jonette Eva, MD REFERRED [id number] Carlena Sax, FNP  PROCEDURE DATE: 08/11/2013 PROCEDURE:   EGD w/ biopsy  INDICATIONS:abd pain, diarrhea, WEIGHT LOSS-USES ASA/NSAIDS-PMHx: PUD. MEDICATIONS: Promethazine (Phenergan) 12.5mg  IV, Demerol 75 mg IV, and Versed 5 mg IV TOPICAL ANESTHETIC:   Cetacaine Spray  DESCRIPTION OF PROCEDURE:     Physical exam was performed.  Informed consent was obtained from the patient after explaining the benefits, risks, and alternatives to the procedure.  The patient was connected to the monitor and placed in the left lateral position.  Continuous oxygen was provided by nasal cannula and IV medicine administered through an indwelling cannula.  After administration of sedation, the patients esophagus was intubated and the EG-2990i ([id number])  endoscope was advanced under direct visualization to the second portion of the duodenum.  The scope was removed slowly by carefully examining the color, texture, anatomy, and integrity of the mucosa on the way out.  The patient was recovered in endoscopy and discharged home in satisfactory condition.   ESOPHAGUS: IRREGULAR Z LINE.  OTHERWISE NL ESOPHAGUS.   STOMACH: Three irregular shaped, deep and clean-based ulcers ranging between 3-64mm in size with surrounding edema, heaped up edges and active oozing of blood were found in the gastric antrum and at the pylorus.  Biopsies were taken at edge of the ulcers and at the center of the ulcers(6/2).   DUODENUM: The duodenal mucosa showed no abnormalities in the bulb and second portion of the duodenum. Cold forcep biopsies were taken in the second portion. TO EVALUATE FOR CELIAC SPRUE.  COMPLICATIONS:   None  ENDOSCOPIC IMPRESSION: 1.   ABDOMINAL PAIN DUE TO PUD 2.   NO  OBVIOUS SOURCE FOR DIARRHEA IDENTIFIED.  RECOMMENDATIONS: CONTINUE PROTONIX.  TAKE 30 MINUTES PRIOR TO MEALS TWICE DAILY. STRICTLY AVOID ASPIRIN, BC/GOODY POWDERS, IBUPROFEN/MOTRIN, OR NAPROXEN/ALEVE FOR 2 WEEKS. MAY RESTART ASA/NSAIDS IF MEDICALLY NCESSARY AVOID ITEMS THAT TRIGGER ULCERS. FOLLOW A HIGH FIBER.  AVOID ITEMS THAT CAUSE BLOATING.  REPEAT EGD IN 3 MOS. FOLLOW UP IN 4 MOS.   REPEAT EXAM:   _______________________________ Rosalie DoctorJonette Eva, MD 08/11/2013 3:44 PM       PATIENT NAME:  Cicely, Ortner MR#: [id number]

## 2013-08-12 ENCOUNTER — Encounter (HOSPITAL_COMMUNITY): Payer: Self-pay | Admitting: Gastroenterology

## 2013-08-18 ENCOUNTER — Telehealth: Payer: Self-pay | Admitting: Gastroenterology

## 2013-08-18 NOTE — Telephone Encounter (Signed)
Please call pt. Cynthia Munoz stomach Bx shows gastritis FROM ASA/NSAIDS.   Cynthia Munoz SMALL BOWEL BIOPSIES ARE NORMAL.   CONTINUE PROTONIX. TAKE 30 MINUTES PRIOR TO MEALS TWICE DAILY. STRICTLY AVOID ASPIRIN, BC/GOODY POWDERS, IBUPROFEN/MOTRIN, OR NAPROXEN/ALEVE FOR UNLESS MEDICALLY NECESSARY FOR 3 MOS BECAUSE YOU HAVE ULCERS. AVOID ITEMS THAT TRIGGER ULCERS.  FOLLOW A LOW FAT/HIGH FIBER. AVOID ITEMS THAT CAUSE BLOATING.   REPEAT EGD IN 3 MOS. SHE WILL NEED A CBC IN PRE-OP. FOLLOW UP IN 6 MOS E30 W/ LL FOR EPIGASTRIC PAIN/ANEMIA.

## 2013-08-19 NOTE — Telephone Encounter (Signed)
Called and informed pt.  

## 2013-08-19 NOTE — Telephone Encounter (Signed)
Pt on recall list for 3 month EDG.

## 2013-08-20 NOTE — Telephone Encounter (Signed)
Reminders in epic °

## 2013-12-11 ENCOUNTER — Telehealth: Payer: Self-pay | Admitting: Gastroenterology

## 2013-12-11 ENCOUNTER — Ambulatory Visit: Payer: Medicare PPO | Admitting: Gastroenterology

## 2013-12-11 NOTE — Telephone Encounter (Signed)
CONTACT PT SHE NEEDS TO [facility] EGD AND HAVE CBC IN PREOP.

## 2013-12-11 NOTE — Telephone Encounter (Signed)
Pt was a no show

## 2013-12-12 ENCOUNTER — Other Ambulatory Visit: Payer: Self-pay | Admitting: Gastroenterology

## 2013-12-12 ENCOUNTER — Telehealth: Payer: Self-pay | Admitting: Gastroenterology

## 2013-12-12 DIAGNOSIS — D649 Anemia, unspecified: Secondary | ICD-10-CM

## 2013-12-12 DIAGNOSIS — G8929 Other chronic pain: Secondary | ICD-10-CM

## 2013-12-12 NOTE — Telephone Encounter (Signed)
Patient is scheduled for EGD w/[facility] on 12/31 I have mailed her the instructions and she is aware to also have CBC

## 2013-12-12 NOTE — Telephone Encounter (Signed)
REPEAT EGD. SHE WILL NEED A CBC IN PRE-OP.  FOLLOW UP IN MAR 2015 E30 W/ LL FOR EPIGASTRIC PAIN/ANEMIA.

## 2013-12-12 NOTE — Telephone Encounter (Signed)
Pt no show OV on 12/18. Do you want her to [facility] OV or go ahead an schedule EGD? Please advise.

## 2013-12-12 NOTE — Telephone Encounter (Signed)
REVIEWED.  

## 2013-12-23 ENCOUNTER — Encounter (HOSPITAL_COMMUNITY): Payer: Self-pay | Admitting: Pharmacy Technician

## 2013-12-24 ENCOUNTER — Telehealth: Payer: Self-pay | Admitting: Gastroenterology

## 2013-12-24 NOTE — Telephone Encounter (Signed)
REVIEWED.  

## 2013-12-24 NOTE — Telephone Encounter (Signed)
Pt LMOM this morning before office opened to let us know that she needed to [facility] her EGD for today.

## 2013-12-26 ENCOUNTER — Encounter (HOSPITAL_COMMUNITY): Admission: RE | Payer: Self-pay | Source: Ambulatory Visit

## 2013-12-26 SURGERY — EGD (ESOPHAGOGASTRODUODENOSCOPY)
Anesthesia: Moderate Sedation

## 2013-12-26 NOTE — Progress Notes (Signed)
REVIEWED. Results  EGD AUG 2014 GASTRITIS, NL DUO Bx  TCS AUG 2014 TICS, MOD IH

## 2013-12-29 ENCOUNTER — Ambulatory Visit (HOSPITAL_COMMUNITY): Admission: RE | Admit: 2013-12-29 | Payer: Medicare PPO | Source: Ambulatory Visit | Admitting: Gastroenterology

## 2013-12-29 ENCOUNTER — Telehealth: Payer: Self-pay | Admitting: Gastroenterology

## 2013-12-29 NOTE — Telephone Encounter (Signed)
Patient has no showed twice for her EGD

## 2013-12-30 ENCOUNTER — Other Ambulatory Visit: Payer: Self-pay | Admitting: Gastroenterology

## 2013-12-30 ENCOUNTER — Telehealth: Payer: Self-pay | Admitting: *Deleted

## 2013-12-30 DIAGNOSIS — R1011 Right upper quadrant pain: Secondary | ICD-10-CM

## 2013-12-30 DIAGNOSIS — D509 Iron deficiency anemia, unspecified: Secondary | ICD-10-CM

## 2013-12-30 MED ORDER — PANTOPRAZOLE SODIUM 40 MG PO TBEC: 40.0000 mg | DELAYED_RELEASE_TABLET | Freq: Two times a day (BID) | ORAL | Status: DC

## 2013-12-30 NOTE — Telephone Encounter (Signed)
I called and informed pt. She said she was sick the reason she did not show the last time, and that someone was suppose to call for her. I told her it was very important for her to call and that way she knows it is done. She said OK. She said OK for Darius Bump to call her to reschedule her EGD.

## 2013-12-30 NOTE — Telephone Encounter (Signed)
Patient has been R/S to Tuesday Jan 13 I have mailed her new instructions and she is aware

## 2013-12-30 NOTE — Telephone Encounter (Signed)
Pt called wanting to get her RX refilled pantopratole pt uses [facility] in [place]. Please advise [phone]

## 2013-12-30 NOTE — Telephone Encounter (Signed)
Called and informed pt of the way to ask for refills is from your pharmacy and have them fax over the request. She was appreciative of the info for future reference.

## 2013-12-30 NOTE — Telephone Encounter (Signed)
Patient no showed for her EGD YESTERDAY! This was her second No show for an EGD. She no showed her OV 12/11/13.  Please stress importance of compliance. I gave one month refill on her medications since she has h/o PUD.  She needs to reschedule her EGD and SHOW up.

## 2013-12-31 ENCOUNTER — Encounter (HOSPITAL_COMMUNITY): Payer: Self-pay | Admitting: Pharmacy Technician

## 2013-12-31 ENCOUNTER — Telehealth: Payer: Self-pay

## 2013-12-31 NOTE — Telephone Encounter (Signed)
Pt said she went to [facility] in [place] to pick up her Protonix and they said it was too early. Michela Pitcher she last got it Dec 16, 2013 and it is too early. She said she did not remember picking up any in Dec.  I called the pharmacy and spoke to Ander Purpura, who said the computer is telling her that pt was last billed for Protonix on 12/16/2013 and she cannot have again until 02/22/2014. She thinks pt got from mail order.   Pt was calling from her cell and said she will call me back this afternoon, she could not remember her cell number.

## 2014-01-02 NOTE — Telephone Encounter (Signed)
REVIEWED.  

## 2014-01-02 NOTE — Telephone Encounter (Signed)
Gastroenterology Pre-Procedure Review  Request Date: TRIAGED TODAY  01/02/2014   Cynthia Munoz has pt scheduled for 01/06/2014)  Requesting Physician: Dr. Oneida Alar  PATIENT REVIEW QUESTIONS: The patient responded to the following health history questions as indicated:    1. Diabetes Melitis: no 2. Joint replacements in the past 12 months: no 3. Major health problems in the past 3 months: no 4. Has an artificial valve or MVP: no 5. Has a defibrillator: no 6. Has been advised in past to take antibiotics in advance of a procedure like teeth cleaning: no    MEDICATIONS & ALLERGIES:    Patient reports the following regarding taking any blood thinners:   Plavix? no Aspirin? YES Coumadin? no  Patient confirms/reports the following medications:  Current Outpatient Prescriptions  Medication Sig Dispense Refill  . albuterol (PROVENTIL HFA;VENTOLIN HFA) 108 (90 BASE) MCG/ACT inhaler Inhale 2 puffs into the lungs every 6 (six) hours as needed. Shortness of breath      . aspirin EC 81 MG tablet Take 81 mg by mouth daily.      . diclofenac (VOLTAREN) 75 MG EC tablet Take 1 tablet by mouth 2 (two) times daily.      . fenofibrate (TRICOR) 145 MG tablet Take 145 mg by mouth daily.       Marland Kitchen lisinopril (PRINIVIL,ZESTRIL) 20 MG tablet Take 40 mg by mouth daily.      Marland Kitchen NASONEX 50 MCG/ACT nasal spray Place 2 sprays into the nose daily.      . niacin 500 MG tablet Take 500 mg by mouth 2 (two) times daily.      . pantoprazole (PROTONIX) 40 MG tablet Take 1 tablet (40 mg total) by mouth 2 (two) times daily.  60 tablet  0  . pravastatin (PRAVACHOL) 40 MG tablet Take 80 mg by mouth at bedtime.      . sertraline (ZOLOFT) 100 MG tablet Take 200 mg by mouth daily.       No current facility-administered medications for this visit.    Patient confirms/reports the following allergies:  No Known Allergies  No orders of the defined types were placed in this encounter.    AUTHORIZATION INFORMATION Primary  Insurance:   ID #:   Group #:  Pre-Cert / Auth required:  Pre-Cert / Auth #:   Secondary Insurance:   ID #:  Group #: Pre-Cert / Auth required:  Pre-Cert / Auth #:   SCHEDULE INFORMATION: Procedure has been scheduled as follows:  Date:                    Time:   Location:  This Gastroenterology Pre-Precedure Review Form is being routed to the following provider(s): Barney Drain, MD

## 2014-01-06 ENCOUNTER — Ambulatory Visit (HOSPITAL_COMMUNITY)
Admission: RE | Admit: 2014-01-06 | Discharge: 2014-01-06 | Disposition: A | Discharge status: Home / Self Care | Payer: Medicare PPO | Source: Ambulatory Visit | Attending: Gastroenterology | Admitting: Gastroenterology

## 2014-01-06 ENCOUNTER — Encounter (HOSPITAL_COMMUNITY): Payer: Self-pay | Admitting: *Deleted

## 2014-01-06 ENCOUNTER — Encounter (HOSPITAL_COMMUNITY)
Admission: RE | Disposition: A | Discharge status: Home / Self Care | Payer: Self-pay | Source: Ambulatory Visit | Attending: Gastroenterology

## 2014-01-06 DIAGNOSIS — I10 Essential (primary) hypertension: Secondary | ICD-10-CM | POA: Insufficient documentation

## 2014-01-06 DIAGNOSIS — Z79899 Other long term (current) drug therapy: Secondary | ICD-10-CM | POA: Insufficient documentation

## 2014-01-06 DIAGNOSIS — K277 Chronic peptic ulcer, site unspecified, without hemorrhage or perforation: Secondary | ICD-10-CM | POA: Insufficient documentation

## 2014-01-06 DIAGNOSIS — Z8711 Personal history of peptic ulcer disease: Secondary | ICD-10-CM

## 2014-01-06 DIAGNOSIS — D509 Iron deficiency anemia, unspecified: Secondary | ICD-10-CM

## 2014-01-06 DIAGNOSIS — K2980 Duodenitis without bleeding: Secondary | ICD-10-CM

## 2014-01-06 DIAGNOSIS — R1011 Right upper quadrant pain: Secondary | ICD-10-CM

## 2014-01-06 DIAGNOSIS — J449 Chronic obstructive pulmonary disease, unspecified: Secondary | ICD-10-CM | POA: Insufficient documentation

## 2014-01-06 DIAGNOSIS — K259 Gastric ulcer, unspecified as acute or chronic, without hemorrhage or perforation: Secondary | ICD-10-CM

## 2014-01-06 DIAGNOSIS — Z7982 Long term (current) use of aspirin: Secondary | ICD-10-CM | POA: Insufficient documentation

## 2014-01-06 DIAGNOSIS — J4489 Other specified chronic obstructive pulmonary disease: Secondary | ICD-10-CM | POA: Insufficient documentation

## 2014-01-06 HISTORY — DX: Shortness of breath: R06.02

## 2014-01-06 HISTORY — DX: Chronic obstructive pulmonary disease, unspecified: J44.9

## 2014-01-06 HISTORY — PX: ESOPHAGOGASTRODUODENOSCOPY: SHX5428

## 2014-01-06 SURGERY — EGD (ESOPHAGOGASTRODUODENOSCOPY)
Anesthesia: Moderate Sedation

## 2014-01-06 MED ORDER — MEPERIDINE HCL 100 MG/ML IJ SOLN
INTRAMUSCULAR | Status: AC
  Filled 2014-01-06: qty 2

## 2014-01-06 MED ORDER — SODIUM CHLORIDE 0.9 % IJ SOLN
INTRAMUSCULAR | Status: AC
  Filled 2014-01-06: qty 10

## 2014-01-06 MED ORDER — MIDAZOLAM HCL 5 MG/5ML IJ SOLN
INTRAMUSCULAR | Status: AC
  Filled 2014-01-06: qty 10

## 2014-01-06 MED ORDER — BUTAMBEN-TETRACAINE-BENZOCAINE 2-2-14 % EX AERO
INHALATION_SPRAY | CUTANEOUS | Status: DC | PRN
  Administered 2014-01-06: 2 via TOPICAL

## 2014-01-06 MED ORDER — PROMETHAZINE HCL 25 MG/ML IJ SOLN
INTRAMUSCULAR | Status: DC | PRN
  Administered 2014-01-06: 12.5 mg via INTRAVENOUS

## 2014-01-06 MED ORDER — STERILE WATER FOR IRRIGATION IR SOLN
Status: DC | PRN
  Administered 2014-01-06: 10:00:00

## 2014-01-06 MED ORDER — MIDAZOLAM HCL 5 MG/5ML IJ SOLN
INTRAMUSCULAR | Status: DC | PRN
  Administered 2014-01-06: 2 mg via INTRAVENOUS
  Administered 2014-01-06: 1 mg via INTRAVENOUS

## 2014-01-06 MED ORDER — SODIUM CHLORIDE 0.9 % IV SOLN
INTRAVENOUS | Status: DC
  Administered 2014-01-06: 09:00:00 via INTRAVENOUS

## 2014-01-06 MED ORDER — PROMETHAZINE HCL 25 MG/ML IJ SOLN
INTRAMUSCULAR | Status: AC
  Filled 2014-01-06: qty 1

## 2014-01-06 MED ORDER — MEPERIDINE HCL 100 MG/ML IJ SOLN
INTRAMUSCULAR | Status: DC | PRN
  Administered 2014-01-06 (×2): 25 mg via INTRAVENOUS

## 2014-01-06 NOTE — H&P (Signed)
Primary Care Physician:  Tempie Hoist, FNP Primary Gastroenterologist:  Dr. Oneida Alar  Pre-Procedure History & Physical: HPI:  Cynthia Munoz is a 63 y.o. female here for peptic ulcer disease.  Past Medical History  Diagnosis Date  . Hypertension   . High cholesterol   . Depression   . Anxiety   . Skin cancer, basal cell   . GERD (gastroesophageal reflux disease)   . Shortness of breath   . COPD (chronic obstructive pulmonary disease)     Past Surgical History  Procedure Laterality Date  . Cholecystectomy    . Back surgery    . Esophagogastroduodenoscopy  August 2013    Dr. Ria Bush Hou--> protocol.c reflux esophagitis. Nonbleeding gastric ulcers. Hiatal hernia. Biopsies negative for H. pylori or Barrett's esophagus. Followup EGD recommended but not done.  . Colonoscopy  August 2010    Dr. Barnabas Lister Spainhour--> sessile polyp removed from the rectum into from the sigmoid colon. Pathology: Hyperplastic   . Small bowel capsule endoscopy  October 2010    Dr. Barnabas Lister Spainhour--> normal.   . Colonoscopy with esophagogastroduodenoscopy (egd) N/A 08/11/2013    Procedure: COLONOSCOPY WITH ESOPHAGOGASTRODUODENOSCOPY (EGD);  Surgeon: Danie Binder, MD;  Location: [facility];  Service: Endoscopy;  Laterality: N/A;  12:15  . Tubal ligation      Prior to Admission medications   Medication Sig Start Date End Date Taking? Authorizing Provider  albuterol (PROVENTIL HFA;VENTOLIN HFA) 108 (90 BASE) MCG/ACT inhaler Inhale 2 puffs into the lungs every 6 (six) hours as needed. Shortness of breath   Yes Historical Provider, MD  aspirin EC 81 MG tablet Take 81 mg by mouth daily.   Yes Historical Provider, MD  diclofenac (VOLTAREN) 75 MG EC tablet Take 1 tablet by mouth 2 (two) times daily. 11/02/13  Yes Historical Provider, MD  fenofibrate (TRICOR) 145 MG tablet Take 145 mg by mouth daily.    Yes Historical Provider, MD  lisinopril (PRINIVIL,ZESTRIL) 20 MG tablet Take 40 mg by mouth daily.   Yes Historical  Provider, MD  NASONEX 50 MCG/ACT nasal spray Place 2 sprays into the nose daily. 06/05/13  Yes Historical Provider, MD  niacin 500 MG tablet Take 500 mg by mouth 2 (two) times daily.   Yes Historical Provider, MD  pantoprazole (PROTONIX) 40 MG tablet Take 1 tablet (40 mg total) by mouth 2 (two) times daily. 12/30/13  Yes Mahala Menghini, PA-C  pravastatin (PRAVACHOL) 40 MG tablet Take 80 mg by mouth at bedtime.   Yes Historical Provider, MD  sertraline (ZOLOFT) 100 MG tablet Take 200 mg by mouth daily.   Yes Historical Provider, MD    Allergies as of 12/30/2013  . (No Known Allergies)    Family History  Problem Relation Age of Onset  . Colon cancer Paternal Aunt     greater >60  . Lung cancer Father   . Heart disease Father   . Liver disease Neg Hx   . Celiac disease Neg Hx   . Diverticulitis Sister     History   Social History  . Marital Status: Divorced    Spouse Name: N/A    Number of Children: 2  . Years of Education: N/A   Occupational History  . Not on file.   Social History Main Topics  . Smoking status: Current Every Day Smoker -- 0.50 packs/day for 38 years    Types: Cigarettes  . Smokeless tobacco: Not on file  . Alcohol Use: No  . Drug Use:  No  . Sexual Activity: Not on file   Other Topics Concern  . Not on file   Social History Narrative  . No narrative on file    Review of Systems: See HPI, otherwise negative ROS   Physical Exam: BP 166/82  Pulse 95  Temp(Src) 98.1 F (36.7 C) (Oral)  Resp 23  Ht 5\' 3"  (1.6 m)  Wt 141 lb (63.957 kg)  BMI 24.98 kg/m2  SpO2 99% General:   Alert,  pleasant and cooperative in NAD Head:  Normocephalic and atraumatic. Neck:  Supple; Lungs:  Clear throughout to auscultation.    Heart:  Regular rate and rhythm. Abdomen:  Soft, nontender and nondistended. Normal bowel sounds, without guarding, and without rebound.   Neurologic:  Alert and  oriented x4;  grossly normal neurologically.  Impression/Plan:      PUD  PLAN:  REPEAT EGD TO CONFIRM ULCERS ARE HEALED.

## 2014-01-06 NOTE — Op Note (Addendum)
[facility] [address] [place], [address]   ENDOSCOPY PROCEDURE REPORT  PATIENT: Cynthia, Munoz  MR#: [id number] BIRTHDATE: 09/27/1951 , 62  yrs. old GENDER: Female  ENDOSCOPIST: Barney Drain, MD REFERRED IN:OMVEH Blair, FNP  PROCEDURE DATE: 01/06/2014 PROCEDURE:   EGD w/ biopsy  INDICATIONS:PMHx: PUD ON EGD AUG 2014.  CONTINUES ON PROTONIX, ASA  DICLOFENAC.Marland Kitchen MEDICATIONS: Promethazine (Phenergan) 12.5mg  IV, Demerol 50 mg IV, and Versed 3 mg IV TOPICAL ANESTHETIC:   Cetacaine Spray  DESCRIPTION OF PROCEDURE:     Physical exam was performed.  Informed consent was obtained from the patient after explaining the benefits, risks, and alternatives to the procedure.  The patient was connected to the monitor and placed in the left lateral position.  Continuous oxygen was provided by nasal cannula and IV medicine administered through an indwelling cannula.  After administration of sedation, the patients esophagus was intubated and the EG-2990i ([id number])  endoscope was advanced under direct visualization to the second portion of the duodenum.  The scope was removed slowly by carefully examining the color, texture, anatomy, and integrity of the mucosa on the way out.  The patient was recovered in endoscopy and discharged home in satisfactory condition.   ESOPHAGUS: The mucosa of the esophagus appeared normal. STOMACH: A medium sized irregular shaped, deep and clean-based ulcer with surrounding edema was found in the prepyloric region of the stomach.  Biopsies were taken at edge of the ulcer and at the center of the ulcer.   A small linear and clean-based ulcer was found at the pylorus.  Biopsies were taken at edge of the ulcer, at the center of the ulcer and around the ulcer.   DUODENUM: Mild duodenal inflammation was found in the bulb and second portion of the duodenum. COMPLICATIONS:   None  ENDOSCOPIC IMPRESSION: 1.   Small ulcer was found at the  pylorus 2.   Medium sized ulcer was found in the prepyloric region of the stomach 3.   MILD DUODENITIS in the bulb and second portion of the duodenum  RECOMMENDATIONS: STRICTLY AVOID ASPIRIN, DICLOFENAC, BC/GOODY POWDERS, IBUPROFEN/MOTRIN, OR NAPROXEN/ALEVE BECAUSE ULCERS IN STOMACH HAVE NOT HEALED. USE TYLENOL AS NEEDED FOR PAIN. STOP SMOKING.  IT WILL HELP ULCERS HEAL. CONTINUE PROTONIX.  TAKE 30 MINUTES PRIOR TO YOUR MEALS TWICE DAILY. FOLLOW A LOW FAT DIET. FOLLOW UP IN 4 MOS.  WILL NEED REPEAT EGD AFTER NEXT VISIT TO CONFIRM ULCERS HAVE HEALED.   REPEAT EXAM:   _______________________________ Lorrin MaisBarney Drain, MD 01/06/2014 2:20 PM Revised: 01/06/2014 2:20 PM      PATIENT NAME:  Cynthia, Munoz MR#: [id number]

## 2014-01-06 NOTE — Discharge Instructions (Signed)
ONLY ONE ULCER HAS HEALED . YOU STILL HAVE ONE SMALL AND ONE LARGE ulcer in your stomach. I BIOPSIED YOUR STOMACH AND YOUR ULCER.   STRICTLY AVOID ASPIRIN, DICLOFENAC, BC/GOODY POWDERS, IBUPROFEN/MOTRIN, OR NAPROXEN/ALEVE BECAUSE YOU HAVE A ULCERS IN YOUR STOMACH THAT HAVE NOT HEALED AND CAN LEAD TO STOMACH CANCER.  USE TYLENOL AS NEEDED FOR PAIN.  STOP SMOKING. IT WILL HELP YOUR ULCERS HEAL.  CONTINUE PROTONIX.  TAKE 30 MINUTES PRIOR TO YOUR MEALS TWICE DAILY.  FOLLOW A LOW FAT DIET. SEE INFO BELOW.   FOLLOW UP IN 4 MOS. WILL NEED REPEAT EGD AFTER NEXT VISIT TO CONFIRM ULCERS HAVE HEALED.    UPPER ENDOSCOPY AFTER CARE Read the instructions outlined below and refer to this sheet in the next week. These discharge instructions provide you with general information on caring for yourself after you leave the hospital. While your treatment has been planned according to the most current medical practices available, unavoidable complications occasionally occur. If you have any problems or questions after discharge, call DR. Genessa Beman, [phone].  ACTIVITY  You may resume your regular activity, but move at a slower pace for the next 24 hours.   Take frequent rest periods for the next 24 hours.   Walking will help get rid of the air and reduce the bloated feeling in your belly (abdomen).   No driving for 24 hours (because of the medicine (anesthesia) used during the test).   You may shower.   Do not sign any important legal documents or operate any machinery for 24 hours (because of the anesthesia used during the test).    NUTRITION  Drink plenty of fluids.   You may resume your normal diet as instructed by your doctor.   Begin with a light meal and progress to your normal diet. Heavy or fried foods are harder to digest and may make you feel sick to your stomach (nauseated).   Avoid alcoholic beverages for 24 hours or as instructed.    MEDICATIONS  You may resume your normal  medications.   WHAT YOU CAN EXPECT TODAY  Some feelings of bloating in the abdomen.   Passage of more gas than usual.    IF YOU HAD A BIOPSY TAKEN DURING THE UPPER ENDOSCOPY:    Eat a soft diet IF YOU HAVE NAUSEA, BLOATING, ABDOMINAL PAIN, OR VOMITING.    FINDING OUT THE RESULTS OF YOUR TEST Not all test results are available during your visit. DR. Oneida Alar WILL CALL YOU WITHIN 7 DAYS OF YOUR PROCEDUE WITH YOUR RESULTS. Do not assume everything is normal if you have not heard from DR. Ilyanna Baillargeon IN ONE WEEK, CALL HER OFFICE AT [phone].  SEEK IMMEDIATE MEDICAL ATTENTION AND CALL THE OFFICE: [phone] IF:  You have more than a spotting of blood in your stool.   Your belly is swollen (abdominal distention).   You are nauseated or vomiting.   You have a temperature over 101F.   You have abdominal pain or discomfort that is severe or gets worse throughout the day.   Ulcer Disease (Peptic Ulcer, Gastric Ulcer, Duodenal Ulcer) You have an ulcer. This may be in your stomach (gastric ulcer) or in the first part of your small bowel, the duodenum (duodenal ulcer). An ulcer is a break in the lining of the stomach or duodenum. The ulcer causes erosion into the deeper tissue.  CAUSES The stomach has a lining to protect itself from the acid that digests food. The lining can be damaged in two  main ways:  The Helico Pylori bacteria (H. Pyolori) can infect the lining of the stomach and cause ulcers.   Nonsteroidal, anti-inflammatory medications (NSAIDS) can cause gastric ulcerations.   Smoking tobacco can increase the acid in the stomach. This can lead to ulcers, and will impair healing of ulcers.   Other factors, such as alcohol use and stress may contribute to ulcer formation.  SYMPTOMS The problems (symptoms) of ulcer disease are usually a burning or gnawing of the mid-upper belly (abdomen). This is often worse on an empty stomach and may get better with food. This may be  associated with feeling sick to your stomach (nausea), bloating, and vomiting.  HOME CARE INSTRUCTIONS Continue regular work and usual activities unless advised otherwise by your caregiver.  Avoid tobacco, alcohol, and caffeine. Tobacco use will decrease and slow the rates of healing.   Avoid foods that seem to aggravate or cause discomfort.    Low-Fat Diet BREADS, CEREALS, PASTA, RICE, DRIED PEAS, AND BEANS These products are high in carbohydrates and most are low in fat. Therefore, they can be increased in the diet as substitutes for fatty foods. They too, however, contain calories and should not be eaten in excess. Cereals can be eaten for snacks as well as for breakfast.   FRUITS AND VEGETABLES It is good to eat fruits and vegetables. Besides being sources of fiber, both are rich in vitamins and some minerals. They help you get the daily allowances of these nutrients. Fruits and vegetables can be used for snacks and desserts.  MEATS Limit lean meat, chicken, [place], and fish to no more than 6 ounces per day. Beef, Pork, and Lamb Use lean cuts of beef, pork, and lamb. Lean cuts include:  Extra-lean ground beef.  Arm roast.  Sirloin tip.  Center-cut ham.  Round steak.  Loin chops.  Rump roast.  Tenderloin.  Trim all fat off the outside of meats before cooking. It is not necessary to severely decrease the intake of red meat, but lean choices should be made. Lean meat is rich in protein and contains a highly absorbable form of iron. Premenopausal women, in particular, should avoid reducing lean red meat because this could increase the risk for low red blood cells (iron-deficiency anemia).  Chicken and [place] These are good sources of protein. The fat of poultry can be reduced by removing the skin and underlying fat layers before cooking. Chicken and [place] can be substituted for lean red meat in the diet. Poultry should not be fried or covered with high-fat sauces. Fish and  Shellfish Fish is a good source of protein. Shellfish contain cholesterol, but they usually are low in saturated fatty acids. The preparation of fish is important. Like chicken and [place], they should not be fried or covered with high-fat sauces. EGGS Egg whites contain no fat or cholesterol. They can be eaten often. Try 1 to 2 egg whites instead of whole eggs in recipes or use egg substitutes that do not contain yolk. MILK AND DAIRY PRODUCTS Use skim or 1% milk instead of 2% or whole milk. Decrease whole milk, natural, and processed cheeses. Use nonfat or low-fat (2%) cottage cheese or low-fat cheeses made from vegetable oils. Choose nonfat or low-fat (1 to 2%) yogurt. Experiment with evaporated skim milk in recipes that call for heavy cream. Substitute low-fat yogurt or low-fat cottage cheese for sour cream in dips and salad dressings. Have at least 2 servings of low-fat dairy products, such as 2 glasses of skim (or  1%) milk each day to help get your daily calcium intake. FATS AND OILS Reduce the total intake of fats, especially saturated fat. Butterfat, lard, and beef fats are high in saturated fat and cholesterol. These should be avoided as much as possible. Vegetable fats do not contain cholesterol, but certain vegetable fats, such as coconut oil, palm oil, and palm kernel oil are very high in saturated fats. These should be limited. These fats are often used in bakery goods, processed foods, popcorn, oils, and nondairy creamers. Vegetable shortenings and some peanut butters contain hydrogenated oils, which are also saturated fats. Read the labels on these foods and check for saturated vegetable oils. Unsaturated vegetable oils and fats do not raise blood cholesterol. However, they should be limited because they are fats and are high in calories. Total fat should still be limited to 30% of your daily caloric intake. Desirable liquid vegetable oils are corn oil, cottonseed oil, olive oil, canola oil,  safflower oil, soybean oil, and sunflower oil. Peanut oil is not as good, but small amounts are acceptable. Buy a heart-healthy tub margarine that has no partially hydrogenated oils in the ingredients. Mayonnaise and salad dressings often are made from unsaturated fats, but they should also be limited because of their high calorie and fat content. Seeds, nuts, peanut butter, olives, and avocados are high in fat, but the fat is mainly the unsaturated type. These foods should be limited mainly to avoid excess calories and fat. OTHER EATING TIPS Snacks  Most sweets should be limited as snacks. They tend to be rich in calories and fats, and their caloric content outweighs their nutritional value. Some good choices in snacks are graham crackers, melba toast, soda crackers, bagels (no egg), English muffins, fruits, and vegetables. These snacks are preferable to snack crackers, [place] fries, TORTILLA CHIPS, and POTATO chips. Popcorn should be air-popped or cooked in small amounts of liquid vegetable oil. Desserts Eat fruit, low-fat yogurt, and fruit ices instead of pastries, cake, and cookies. Sherbet, angel food cake, gelatin dessert, frozen low-fat yogurt, or other frozen products that do not contain saturated fat (pure fruit juice bars, frozen ice pops) are also acceptable.  COOKING METHODS Choose those methods that use little or no fat. They include: Poaching.  Braising.  Steaming.  Grilling.  Baking.  Stir-frying.  Broiling.  Microwaving.  Foods can be cooked in a nonstick pan without added fat, or use a nonfat cooking spray in regular cookware. Limit fried foods and avoid frying in saturated fat. Add moisture to lean meats by using water, broth, cooking wines, and other nonfat or low-fat sauces along with the cooking methods mentioned above. Soups and stews should be chilled after cooking. The fat that forms on top after a few hours in the refrigerator should be skimmed off. When preparing meals,  avoid using excess salt. Salt can contribute to raising blood pressure in some people.  EATING AWAY FROM HOME Order entres, potatoes, and vegetables without sauces or butter. When meat exceeds the size of a deck of cards (3 to 4 ounces), the rest can be taken home for another meal. Choose vegetable or fruit salads and ask for low-calorie salad dressings to be served on the side. Use dressings sparingly. Limit high-fat toppings, such as bacon, crumbled eggs, cheese, sunflower seeds, and olives. Ask for heart-healthy tub margarine instead of butter.

## 2014-01-08 ENCOUNTER — Encounter (HOSPITAL_COMMUNITY): Payer: Self-pay | Admitting: Gastroenterology

## 2014-01-21 ENCOUNTER — Telehealth: Payer: Self-pay | Admitting: Gastroenterology

## 2014-01-21 NOTE — Telephone Encounter (Signed)
Please call pt. HER stomach Bx shows ULCERS FROM USING ASPIRIN AND DICLOFENAC.   STRICTLY AVOID ASPIRIN, DICLOFENAC, BC/GOODY POWDERS, IBUPROFEN/MOTRIN, OR NAPROXEN/ALEVE BECAUSE YOU ULCERS IN YOUR STOMACH THAT HAVE NOT HEALED AND CAN LEAD TO STOMACH CANCER.  USE TYLENOL AS NEEDED FOR PAIN.  STOP SMOKING. IT WILL HELP YOUR ULCERS HEAL.  CONTINUE PROTONIX.  TAKE 30 MINUTES PRIOR TO YOUR MEALS TWICE DAILY.  FOLLOW A LOW FAT DIET.    FOLLOW UP IN 4 MOS E15 SLF. WILL NEED REPEAT EGD AFTER NEXT VISIT TO CONFIRM ULCERS HAVE HEALED.

## 2014-01-22 NOTE — Telephone Encounter (Signed)
LMOM to call.

## 2014-01-22 NOTE — Telephone Encounter (Signed)
Reminder in epic °

## 2014-01-23 NOTE — Telephone Encounter (Signed)
Pt was informed.

## 2014-01-29 ENCOUNTER — Telehealth: Payer: Self-pay | Admitting: *Deleted

## 2014-01-29 NOTE — Telephone Encounter (Signed)
I called pt and she said since she cannot take Diclofenac because of her stomach, would she be a candidate for a patch.  Please advise!

## 2014-01-29 NOTE — Telephone Encounter (Signed)
Pt called and said Dr. Oneida Alar took her off of the Diclosenac 75mg , and pt said she has a Chiropractor that comes every year and she spoke with pt about a reflector patch, the nurse told pt to ask Dr. Oneida Alar about that medication. Please advise [phone]

## 2014-01-29 NOTE — Telephone Encounter (Signed)
PLEASE CALL PT. SHE SHOULD ASK THE PERSON PRESCRIBING THE PATCH IF THEY THINK IT IS SAFE GIVEN HER HISTORY OF ULCERS.

## 2014-01-29 NOTE — Telephone Encounter (Signed)
I called pt and she said the person that comes out is from [facility] and only comes once a year. She suggested it and said she thought that it might be better for the pt than the oral medication.

## 2014-01-29 NOTE — Telephone Encounter (Signed)
What is the name of the patch?

## 2014-01-30 NOTE — Telephone Encounter (Signed)
Pt did not have a name other that she said it was called a Reflector patch in the Diclofenac family. She said the [facility] nurse just asked her to check it out and see if she used if it would be less harmful than the oral NSAIDS.

## 2014-01-30 NOTE — Telephone Encounter (Signed)
Called and informed pt.  

## 2014-01-30 NOTE — Telephone Encounter (Signed)
PLEASE CALL PT. SHE NEEDS TO SEE WHOEVER PRESCRIBED THE DICLOFENAC & ASK THEM.

## 2014-03-10 NOTE — Progress Notes (Signed)
REVIEWED.  EGD AUG 2014 GASTRITIS, NL DUO Bx TCS AUG 2014 TICS, MOD IH

## 2014-05-11 ENCOUNTER — Encounter: Payer: Self-pay | Admitting: Gastroenterology

## 2014-11-11 ENCOUNTER — Encounter: Payer: Self-pay | Admitting: Orthopedic Surgery

## 2015-05-28 ENCOUNTER — Telehealth: Payer: Self-pay | Admitting: Gastroenterology

## 2015-05-28 NOTE — Telephone Encounter (Signed)
Needs office visit. Likely dark stool secondary to pepto. If persistent despite not taking pepto, would need urgent evaluation.

## 2015-05-28 NOTE — Telephone Encounter (Signed)
Patient called this morning asking to speak nurse she is having diarrhea and has concerns because it is black. Please call her back at [phone]

## 2015-05-28 NOTE — Telephone Encounter (Signed)
I called pt. She said she has had diarrhea some all week. 3-4 episodes most days. 2 yesterday that had very dark stools. She had loose stool this AM and it was very dark. She has been taking Pepto and I told her that would cause dark stools. She said she feels fine, no other complaints.  Last seen here on 07/29/2013 by Laban Emperor. NP.   Please advise!

## 2015-05-28 NOTE — Telephone Encounter (Signed)
LMOM to call.

## 2015-05-31 NOTE — Telephone Encounter (Signed)
Pt called and is scheduled for OV with Laban Emperor, NP on 06/02/2015 at 2:30 PM.

## 2015-06-02 ENCOUNTER — Encounter: Payer: Self-pay | Admitting: Gastroenterology

## 2015-06-02 ENCOUNTER — Ambulatory Visit (INDEPENDENT_AMBULATORY_CARE_PROVIDER_SITE_OTHER): Payer: Medicare PPO | Admitting: Gastroenterology

## 2015-06-02 ENCOUNTER — Other Ambulatory Visit: Payer: Self-pay

## 2015-06-02 VITALS — BP 143/73 | HR 63 | Temp 98.2°F | Ht 63.0 in | Wt 130.0 lb

## 2015-06-02 DIAGNOSIS — D509 Iron deficiency anemia, unspecified: Secondary | ICD-10-CM

## 2015-06-02 DIAGNOSIS — K921 Melena: Secondary | ICD-10-CM

## 2015-06-02 DIAGNOSIS — K279 Peptic ulcer, site unspecified, unspecified as acute or chronic, without hemorrhage or perforation: Secondary | ICD-10-CM

## 2015-06-02 NOTE — Assessment & Plan Note (Signed)
64 year old female presenting with black stool, unclear if melena, with history of PUD in remote past with last EGD in 2015 with small ulcer at the pylorus, medium sized ulcer at prepyloric region, negative H.pylori. Mild duodenitis noted. She was due for surveillance but did not follow-up. Denies any further use of NSAIDs, aspirin powders. No dysphagia, abdominal pain. Needs CBC, iron, ferritin, TIBC. EGD as soon as possible for further assessment.   Proceed with upper endoscopy in the near future with Dr. Oneida Alar. The risks, benefits, and alternatives have been discussed in detail with patient. They have stated understanding and desire to proceed.  Phenergan 12.5 mg IV CBC, iron, ferritin, TIBC now

## 2015-06-02 NOTE — Assessment & Plan Note (Signed)
Likely secondary to known history of PUD. Thorough work-up in past. Not on supplemental iron. Check iron studies now. EGD as planned. May ultimately need capsule study depending on labs and EGD findings. Hematology referral if persistent IDA.

## 2015-06-02 NOTE — Patient Instructions (Signed)
We have scheduled you for an upper endoscopy with Dr. Oneida Alar.   Please have blood work done today. We will call with results.

## 2015-06-02 NOTE — Progress Notes (Signed)
Primary Care Physician:  [facility] Primary Gastroenterologist:  Dr. Oneida Alar   Chief Complaint  Patient presents with  . Follow-up    HPI:   Cynthia Munoz is a 64 y.o. female presenting today in follow-up with history of PUD, IDA, chronic NSAID use. Historically has used BC powders, Voltaren. Last EGD in Jan 2015 with small ulcer at the pylorus, medium sized ulcer at prepyloric region, negative H.pylori. Mild duodenitis noted. She was due for surveillance but did not follow-up. Now presenting secondary to concerns for black stool in the setting of Pepto.   Took Pepto last Monday but no more the rest of the week. Black stool started Wednesday or Thursday, persistent despite stopping Pepto. States diarrhea had resolved for awhile, having a BM about every other day without any difficulties, then diarrhea started up again last Monday. No diarrhea this week. Hard to tell if it is "sticky". BM now each morning. No abdominal pain. No NSAIDs or aspirin powders, off Voltaren now. Protonix twice a day. No recent blood work. No bright red blood per rectum.   Down 11 lbs from Aug 2014. States appetite is so-so.   Past Medical History  Diagnosis Date  . Hypertension   . High cholesterol   . Depression   . Anxiety   . Skin cancer, basal cell   . GERD (gastroesophageal reflux disease)   . Shortness of breath   . COPD (chronic obstructive pulmonary disease)     Past Surgical History  Procedure Laterality Date  . Cholecystectomy    . Back surgery    . Esophagogastroduodenoscopy  August 2013    Dr. Ria Bush Hou--> protocol.c reflux esophagitis. Nonbleeding gastric ulcers. Hiatal hernia. Biopsies negative for H. pylori or Barrett's esophagus. Followup EGD recommended but not done.  . Colonoscopy  August 2010    Dr. Barnabas Lister [place]--> sessile polyp removed from the rectum into from the sigmoid colon. Pathology: Hyperplastic   . Small bowel capsule endoscopy  October 2010    Dr.  Barnabas Lister [place]--> normal.   . Colonoscopy with esophagogastroduodenoscopy (egd) N/A 08/11/2013    Dr. Oneida Alar: 1. Normal mucosa in the terminal ileum. 2. Mild diveticulosois was noted inthe sigmoid colon.  EGD: 1. abd pain due to PUD 2. No obvious source for diarrhea identified. Negative sprue on duodenal biopsy  . Tubal ligation    . Esophagogastroduodenoscopy N/A 01/06/2014    Dr. Oneida Alar: 1. Small ulcer was found at the pylorus 2. Medium sized ulcer was found in the prepyloric region of the stomach  3. Mild duodenitis. in the second portion of the duodenum. Needs surveillance EGD but did not follow-up until 2016. Neg H.pylori    Current Outpatient Prescriptions  Medication Sig Dispense Refill  . albuterol (PROVENTIL HFA;VENTOLIN HFA) 108 (90 BASE) MCG/ACT inhaler Inhale 2 puffs into the lungs every 6 (six) hours as needed. Shortness of breath    . fenofibrate (TRICOR) 145 MG tablet Take 145 mg by mouth daily.     Marland Kitchen lisinopril (PRINIVIL,ZESTRIL) 20 MG tablet Take 40 mg by mouth daily.    Marland Kitchen NASONEX 50 MCG/ACT nasal spray Place 2 sprays into the nose daily.    . niacin 500 MG tablet Take 500 mg by mouth 2 (two) times daily.    . pantoprazole (PROTONIX) 40 MG tablet Take 1 tablet (40 mg total) by mouth 2 (two) times daily. 60 tablet 0  . pravastatin (PRAVACHOL) 40 MG tablet Take 80 mg by mouth at bedtime.    Marland Kitchen  sertraline (ZOLOFT) 100 MG tablet Take 200 mg by mouth daily.     No current facility-administered medications for this visit.    Allergies as of 06/02/2015  . (No Known Allergies)    Family History  Problem Relation Age of Onset  . Colon cancer Paternal Aunt     greater >60  . Lung cancer Father   . Heart disease Father   . Liver disease Neg Hx   . Celiac disease Neg Hx   . Diverticulitis Sister     History   Social History  . Marital Status: Divorced    Spouse Name: N/A  . Number of Children: 2  . Years of Education: N/A   Occupational History  . Not on file.    Social History Main Topics  . Smoking status: Current Every Day Smoker -- 0.50 packs/day for 38 years    Types: Cigarettes  . Smokeless tobacco: Not on file  . Alcohol Use: No  . Drug Use: No  . Sexual Activity: Not on file   Other Topics Concern  . Not on file   Social History Narrative    Review of Systems: Gen: see HPI CV: Denies chest pain, heart palpitations, peripheral edema, syncope.  Resp: Denies shortness of breath at rest or with exertion. Denies wheezing or cough.  GI: see HPI GU : Denies urinary burning, urinary frequency, urinary hesitancy MS: +joint pain, arthritis Derm: Denies rash, itching, dry skin Psych: Denies depression, anxiety, memory loss, and confusion Heme: Denies bruising, bleeding, and enlarged lymph nodes.  Physical Exam: BP 143/73 mmHg  Pulse 63  Temp(Src) 98.2 F (36.8 C)  Ht 5\' 3"  (1.6 m)  Wt 130 lb (58.968 kg)  BMI 23.03 kg/m2 General:   Alert and oriented. Pleasant and cooperative. Well-nourished and well-developed.  Head:  Normocephalic and atraumatic. Eyes:  Without icterus, sclera clear and conjunctiva pink.  Ears:  Normal auditory acuity. Nose:  No deformity, discharge,  or lesions. Mouth:  No deformity or lesions, oral mucosa pink.  Lungs:  Clear to auscultation bilaterally. No wheezes, rales, or rhonchi. No distress.  Heart:  S1, S2 present without murmurs appreciated.  Abdomen:  +BS, soft, non-tender and non-distended. No HSM noted. No guarding or rebound. No masses appreciated.  Rectal:  Deferred  Msk:  Symmetrical without gross deformities. Normal posture. Extremities:  Without clubbing or edema. Neurologic:  Alert and  oriented x4;  grossly normal neurologically. Psych:  Alert and cooperative. Normal mood and affect.  Lab Results  Component Value Date   IRON 17* 07/24/2013   TIBC NOT CALC 07/24/2013   FERRITIN 12 07/24/2013    Lab Results  Component Value Date   WBC 7.2 07/24/2013   HGB 11.6* 07/24/2013   HCT  38.1 07/24/2013   MCV 72.4* 07/24/2013   PLT 211 07/24/2013

## 2015-06-03 ENCOUNTER — Encounter (HOSPITAL_COMMUNITY): Payer: Self-pay | Admitting: *Deleted

## 2015-06-03 ENCOUNTER — Ambulatory Visit (HOSPITAL_COMMUNITY)
Admission: RE | Admit: 2015-06-03 | Discharge: 2015-06-03 | Disposition: A | Discharge status: Home / Self Care | Payer: Medicare FFS | Source: Ambulatory Visit | Attending: Gastroenterology | Admitting: Gastroenterology

## 2015-06-03 ENCOUNTER — Encounter (HOSPITAL_COMMUNITY)
Admission: RE | Disposition: A | Discharge status: Home / Self Care | Payer: Self-pay | Source: Ambulatory Visit | Attending: Gastroenterology

## 2015-06-03 DIAGNOSIS — K2900 Acute gastritis without bleeding: Secondary | ICD-10-CM | POA: Insufficient documentation

## 2015-06-03 DIAGNOSIS — F17210 Nicotine dependence, cigarettes, uncomplicated: Secondary | ICD-10-CM | POA: Insufficient documentation

## 2015-06-03 DIAGNOSIS — I10 Essential (primary) hypertension: Secondary | ICD-10-CM | POA: Insufficient documentation

## 2015-06-03 DIAGNOSIS — J449 Chronic obstructive pulmonary disease, unspecified: Secondary | ICD-10-CM | POA: Insufficient documentation

## 2015-06-03 DIAGNOSIS — K2980 Duodenitis without bleeding: Secondary | ICD-10-CM | POA: Insufficient documentation

## 2015-06-03 DIAGNOSIS — Z79899 Other long term (current) drug therapy: Secondary | ICD-10-CM | POA: Diagnosis not present

## 2015-06-03 DIAGNOSIS — Z85828 Personal history of other malignant neoplasm of skin: Secondary | ICD-10-CM | POA: Diagnosis not present

## 2015-06-03 DIAGNOSIS — K279 Peptic ulcer, site unspecified, unspecified as acute or chronic, without hemorrhage or perforation: Secondary | ICD-10-CM | POA: Insufficient documentation

## 2015-06-03 DIAGNOSIS — F329 Major depressive disorder, single episode, unspecified: Secondary | ICD-10-CM | POA: Insufficient documentation

## 2015-06-03 DIAGNOSIS — F419 Anxiety disorder, unspecified: Secondary | ICD-10-CM | POA: Insufficient documentation

## 2015-06-03 DIAGNOSIS — K219 Gastro-esophageal reflux disease without esophagitis: Secondary | ICD-10-CM | POA: Insufficient documentation

## 2015-06-03 HISTORY — PX: ESOPHAGOGASTRODUODENOSCOPY: SHX5428

## 2015-06-03 LAB — CBC
HCT: 45.6 % (ref 36.0–46.0)
HEMOGLOBIN: 15.1 g/dL — AB (ref 12.0–15.0)
MCH: 26.4 pg (ref 26.0–34.0)
MCHC: 33.1 g/dL (ref 30.0–36.0)
MCV: 79.6 fL (ref 78.0–100.0)
MPV: 9.6 fL (ref 8.6–12.4)
Platelets: 133 10*3/uL — ABNORMAL LOW (ref 150–400)
RBC: 5.73 MIL/uL — AB (ref 3.87–5.11)
RDW: 17.5 % — ABNORMAL HIGH (ref 11.5–15.5)
WBC: 7.8 10*3/uL (ref 4.0–10.5)

## 2015-06-03 LAB — FERRITIN: Ferritin: 23 ng/mL (ref 10–291)

## 2015-06-03 LAB — IRON AND TIBC
%SAT: 15 % — ABNORMAL LOW (ref 20–55)
IRON: 58 ug/dL (ref 42–145)
TIBC: 377 ug/dL (ref 250–470)
UIBC: 319 ug/dL (ref 125–400)

## 2015-06-03 SURGERY — EGD (ESOPHAGOGASTRODUODENOSCOPY)
Anesthesia: Moderate Sedation

## 2015-06-03 MED ORDER — MIDAZOLAM HCL 5 MG/5ML IJ SOLN
INTRAMUSCULAR | Status: AC
  Filled 2015-06-03: qty 10

## 2015-06-03 MED ORDER — PROMETHAZINE HCL 25 MG/ML IJ SOLN
12.5000 mg | Freq: Once | INTRAMUSCULAR | Status: AC
  Administered 2015-06-03: 12.5 mg via INTRAVENOUS

## 2015-06-03 MED ORDER — MIDAZOLAM HCL 5 MG/5ML IJ SOLN
INTRAMUSCULAR | Status: DC | PRN
  Administered 2015-06-03: 1 mg via INTRAVENOUS
  Administered 2015-06-03: 2 mg via INTRAVENOUS

## 2015-06-03 MED ORDER — SODIUM CHLORIDE 0.9 % IJ SOLN
INTRAMUSCULAR | Status: AC
  Filled 2015-06-03: qty 3

## 2015-06-03 MED ORDER — MEPERIDINE HCL 100 MG/ML IJ SOLN
INTRAMUSCULAR | Status: AC
  Filled 2015-06-03: qty 2

## 2015-06-03 MED ORDER — LIDOCAINE VISCOUS 2 % MT SOLN
OROMUCOSAL | Status: DC | PRN
  Administered 2015-06-03: 4 mL via OROMUCOSAL

## 2015-06-03 MED ORDER — PROMETHAZINE HCL 25 MG/ML IJ SOLN
INTRAMUSCULAR | Status: AC
  Filled 2015-06-03: qty 1

## 2015-06-03 MED ORDER — MEPERIDINE HCL 100 MG/ML IJ SOLN
INTRAMUSCULAR | Status: DC | PRN
  Administered 2015-06-03 (×2): 25 mg via INTRAVENOUS

## 2015-06-03 MED ORDER — STERILE WATER FOR IRRIGATION IR SOLN
Status: DC | PRN
  Administered 2015-06-03: 13:00:00

## 2015-06-03 MED ORDER — SODIUM CHLORIDE 0.9 % IV SOLN
INTRAVENOUS | Status: DC
  Administered 2015-06-03: 1000 mL via INTRAVENOUS

## 2015-06-03 MED ORDER — LIDOCAINE VISCOUS 2 % MT SOLN
OROMUCOSAL | Status: AC
  Filled 2015-06-03: qty 15

## 2015-06-03 NOTE — Op Note (Signed)
[facility] [address] [place], [address]   ENDOSCOPY PROCEDURE REPORT  PATIENT: Cynthia Munoz, Cynthia Munoz  MR#: [id number] BIRTHDATE: 1951/10/06 , 34  yrs. old GENDER: female  ENDOSCOPIST: Danie Binder, MD REFERRED BY:  PROCEDURE DATE: 2015-06-27 PROCEDURE:   EGD w/ biopsy INDICATIONS:surveillance. FOR ULCERS. JUN 2016: Hb 15.1, FERRITIN 23. PT REPORST SHE USED UBUPROFEN ONE TIME IN PAST YEAR. MEDICATIONS: Demerol 50 mg IV, Versed 3 mg IV, and PHENERGAN 12.5 MG IV TOPICAL ANESTHETIC:   Viscous Xylocaine ASA CLASS:  DESCRIPTION OF PROCEDURE:     Physical exam was performed.  Informed consent was obtained from the patient after explaining the benefits, risks, and alternatives to the procedure.  The patient was connected to the monitor and placed in the left lateral position.  Continuous oxygen was provided by nasal cannula and IV medicine administered through an indwelling cannula.  After administration of sedation, the patients esophagus was intubated and the EG-[id number] ([id number])  endoscope was advanced under direct visualization to the second portion of the duodenum.  The scope was removed slowly by carefully examining the color, texture, anatomy, and integrity of the mucosa on the way out.  The patient was recovered in endoscopy and discharged home in satisfactory condition.   ESOPHAGUS: The mucosa of the esophagus appeared normal.   STOMACH: Moderate erosive gastritis (inflammation) was found in the gastric fundus.  Multiple biopsies were performed using cold forceps. Five non-bleeding, clean-based and shallow ulcers ranging between 3-5 mm in size were found in the prepyloric region of the stomach and gastric antrum.  Biopsies were taken around the ulcers. DUODENUM: Moderate duodenal inflammation was found in the bulb and second portion of the duodenum. COMPLICATIONS: There were no immediate complications.  ENDOSCOPIC IMPRESSION: 1.   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 STRICTLY AVOID ASPIRIN, BC/GOODY POWDERS, IBUPROFEN/MOTRIN, OR NAPROXEN/ALEVE BECAUSE YOU HAVE STOMACH ULCERS. CONTINUE PROTONIX.  TAKE 30 MINUTES PRIOR TO MEALS TWICE DAILY. AWAIT BIOPSY RESULTS. FOLLOW UP in MAY 2017.  REPEAT EXAM:  eSigned:  Danie Binder, MD 06/27/15 2:35 PM   CPT CODES: ICD CODES:  The ICD and CPT codes recommended by this software are interpretations from the data that the clinical staff has captured with the software.  The verification of the translation of this report to the ICD and CPT codes and modifiers is the sole responsibility of the health care institution and practicing physician where this report was generated.  [employer]. will not be held responsible for the validity of the ICD and CPT codes included on this report.  AMA assumes no liability for data contained or not contained herein. CPT is a Designer, television/film set of the [employer].

## 2015-06-03 NOTE — Discharge Instructions (Addendum)
You have SMALL ULCERS, gastritis & DUODENITIS DUE TO YOUR USING ASPIRIN PRODUCTS. THESE FINDING WILL REMAIN IF YOU CONTINUE TO USE THESE PRODUCTS I WOULD AVOID ADDITIONAL ENDOSCOPY UNLESS YOU HAVE A DROP IN YOUR BLOOD COUNT OR BLACK STICKY STOOL THAT LOOKS LIKE TAR. I biopsied your stomach.    STRICTLY AVOID ASPIRIN, BC/GOODY POWDERS, IBUPROFEN/MOTRIN, OR NAPROXEN/ALEVE BECAUSE YOU HAVE STOMACH ULCERS.  CONTINUE PROTONIX. TAKE 30 MINUTES PRIOR TO MEALS TWICE DAILY.  YOUR BIOPSY RESULTS WILL BE AVAILABLE IN MY CHART AFTER  JUN 11 AND MY OFFICE WILL CONTACT YOU IN 10-14 DAYS WITH YOUR RESULTS.   FOLLOW UP in MAY 2017.   UPPER ENDOSCOPY AFTER CARE Read the instructions outlined below and refer to this sheet in the next week. These discharge instructions provide you with general information on caring for yourself after you leave the hospital. While your treatment has been planned according to the most current medical practices available, unavoidable complications occasionally occur. If you have any problems or questions after discharge, call DR. Ezmeralda Stefanick, [phone].  ACTIVITY  You may resume your regular activity, but move at a slower pace for the next 24 hours.   Take frequent rest periods for the next 24 hours.   Walking will help get rid of the air and reduce the bloated feeling in your belly (abdomen).   No driving for 24 hours (because of the medicine (anesthesia) used during the test).   You may shower.   Do not sign any important legal documents or operate any machinery for 24 hours (because of the anesthesia used during the test).    NUTRITION  Drink plenty of fluids.   You may resume your normal diet as instructed by your doctor.   Begin with a light meal and progress to your normal diet. Heavy or fried foods are harder to digest and may make you feel sick to your stomach (nauseated).   Avoid alcoholic beverages for 24 hours or as instructed.    MEDICATIONS  You may  resume your normal medications.   WHAT YOU CAN EXPECT TODAY  Some feelings of bloating in the abdomen.   Passage of more gas than usual.    IF YOU HAD A BIOPSY TAKEN DURING THE UPPER ENDOSCOPY:  Eat a soft diet IF YOU HAVE NAUSEA, BLOATING, ABDOMINAL PAIN, OR VOMITING.    FINDING OUT THE RESULTS OF YOUR TEST Not all test results are available during your visit. DR. Oneida Alar WILL CALL YOU WITHIN 14 DAYS OF YOUR PROCEDUE WITH YOUR RESULTS. Do not assume everything is normal if you have not heard from DR. Nateisha Moyd, CALL HER OFFICE AT [phone].  SEEK IMMEDIATE MEDICAL ATTENTION AND CALL THE OFFICE: [phone] IF:  You have more than a spotting of blood in your stool.   Your belly is swollen (abdominal distention).   You are nauseated or vomiting.   You have a temperature over 101F.   You have abdominal pain or discomfort that is severe or gets worse throughout the day.   ULCERS/Gastritis/DUODENITIS  Gastritis is an inflammation (the body's way of reacting to injury and/or infection) of the stomach. DUODENITIS is an inflammation (the body's way of reacting to injury and/or infection) of the FIRST PART OF THE SMALL INTESTINES. It is often caused by bacterial (germ) infections. It can also be caused BY ASPIRIN, BC/GOODY POWDER'S, (IBUPROFEN) MOTRIN, OR ALEVE (NAPROXEN), chemicals (including alcohol), SPICY FOODS, and medications. This illness may be associated with generalized malaise (feeling tired, not well), UPPER ABDOMINAL STOMACH cramps,  and fever. One common bacterial cause of gastritis is an organism known as H. Pylori. This can be treated with antibiotics.     REFLUX   TREATMENT There are a number of medicines used to treat reflux including: Antacids.  ZANTAC Proton-pump inhibitors: PROTONIX  HOME CARE INSTRUCTIONS Eat 2-3 hours before going to bed.  Try to reach and maintain a healthy weight. LOSE 10-20 LBS Do not eat just a few very large meals. Instead, eat 4 TO  6 smaller meals throughout the day.  Try to identify foods and beverages that make your symptoms worse, and avoid these.  Avoid tight clothing.  Do not exercise right after eating.

## 2015-06-03 NOTE — Progress Notes (Signed)
REVIEWED-NO ADDITIONAL RECOMMENDATIONS. 

## 2015-06-03 NOTE — H&P (Signed)
Primary Care Physician:  [facility] Primary Gastroenterologist:  Dr. Oneida Alar  Pre-Procedure History & Physical: HPI:  Cynthia Munoz is a 64 y.o. female here for PUD.  Past Medical History  Diagnosis Date  . Hypertension   . High cholesterol   . Depression   . Anxiety   . Skin cancer, basal cell   . GERD (gastroesophageal reflux disease)   . Shortness of breath   . COPD (chronic obstructive pulmonary disease)     Past Surgical History  Procedure Laterality Date  . Cholecystectomy    . Back surgery    . Esophagogastroduodenoscopy  August 2013    Dr. Ria Bush Hou--> protocol.c reflux esophagitis. Nonbleeding gastric ulcers. Hiatal hernia. Biopsies negative for H. pylori or Barrett's esophagus. Followup EGD recommended but not done.  . Colonoscopy  August 2010    Dr. Barnabas Lister Spainhour--> sessile polyp removed from the rectum into from the sigmoid colon. Pathology: Hyperplastic   . Small bowel capsule endoscopy  October 2010    Dr. Barnabas Lister Spainhour--> normal.   . Colonoscopy with esophagogastroduodenoscopy (egd) N/A 08/11/2013    Dr. Oneida Alar: 1. Normal mucosa in the terminal ileum. 2. Mild diveticulosois was noted inthe sigmoid colon.  EGD: 1. abd pain due to PUD 2. No obvious source for diarrhea identified. Negative sprue on duodenal biopsy  . Tubal ligation    . Esophagogastroduodenoscopy N/A 01/06/2014    Dr. Oneida Alar: 1. Small ulcer was found at the pylorus 2. Medium sized ulcer was found in the prepyloric region of the stomach  3. Mild duodenitis. in the second portion of the duodenum. Needs surveillance EGD but did not follow-up until 2016. Neg H.pylori    Prior to Admission medications   Medication Sig Start Date End Date Taking? Authorizing Provider  fenofibrate (TRICOR) 145 MG tablet Take 145 mg by mouth daily.    Yes Historical Provider, MD  lisinopril (PRINIVIL,ZESTRIL) 20 MG tablet Take 40 mg by mouth daily.   Yes Historical Provider, MD  NASONEX 50 MCG/ACT nasal  spray Place 2 sprays into the nose daily. 06/05/13  Yes Historical Provider, MD  niacin 500 MG tablet Take 500 mg by mouth 2 (two) times daily.   Yes Historical Provider, MD  pantoprazole (PROTONIX) 40 MG tablet Take 1 tablet (40 mg total) by mouth 2 (two) times daily. 12/30/13  Yes Mahala Menghini, PA-C  pravastatin (PRAVACHOL) 40 MG tablet Take 80 mg by mouth at bedtime.   Yes Historical Provider, MD  sertraline (ZOLOFT) 100 MG tablet Take 200 mg by mouth daily.   Yes Historical Provider, MD  albuterol (PROVENTIL HFA;VENTOLIN HFA) 108 (90 BASE) MCG/ACT inhaler Inhale 2 puffs into the lungs every 6 (six) hours as needed. Shortness of breath    Historical Provider, MD    Allergies as of 06/02/2015  . (No Known Allergies)    Family History  Problem Relation Age of Onset  . Colon cancer Paternal Aunt     greater >60  . Lung cancer Father   . Heart disease Father   . Liver disease Neg Hx   . Celiac disease Neg Hx   . Diverticulitis Sister     History   Social History  . Marital Status: Divorced    Spouse Name: N/A  . Number of Children: 2  . Years of Education: N/A   Occupational History  . Not on file.   Social History Main Topics  . Smoking status: Current Every Day Smoker -- 0.50 packs/day for  38 years    Types: Cigarettes  . Smokeless tobacco: Not on file     Comment: "trying to quit"   . Alcohol Use: No  . Drug Use: No  . Sexual Activity: Not on file   Other Topics Concern  . Not on file   Social History Narrative    Review of Systems: See HPI, otherwise negative ROS   Physical Exam: BP 134/70 mmHg  Pulse 76  Temp(Src) 97.5 F (36.4 C) (Oral)  Resp 22  Ht 5\' 3"  (1.6 m)  Wt 130 lb (58.968 kg)  BMI 23.03 kg/m2  SpO2 96% General:   Alert,  pleasant and cooperative in NAD Head:  Normocephalic and atraumatic. Neck:  Supple; Lungs:  Clear throughout to auscultation.    Heart:  Regular rate and rhythm. Abdomen:  Soft, nontender and nondistended. Normal bowel  sounds, without guarding, and without rebound.   Neurologic:  Alert and  oriented x4;  grossly normal neurologically.  Impression/Plan:    PUD  PLAN:  REPEAT EGD TO CONFIRM ULCERS ARE HEALED.

## 2015-06-04 ENCOUNTER — Other Ambulatory Visit: Payer: Self-pay

## 2015-06-04 NOTE — Telephone Encounter (Signed)
Please notify the patient that we received a refill request for Tricor (fenofibrate) which is a cholesterol medication. We don't usually use this in GI and we did not prescribe it for her so we are unable to refill it for her. She should contact the provider who wrote the prescription (likely PCP or cardiology).

## 2015-06-07 ENCOUNTER — Encounter (HOSPITAL_COMMUNITY): Payer: Self-pay | Admitting: Gastroenterology

## 2015-06-09 ENCOUNTER — Telehealth: Payer: Self-pay | Admitting: *Deleted

## 2015-06-09 NOTE — Telephone Encounter (Signed)
Pt called [facility] last night stating her chest has been hurting since Thursday after she had an endoscopy done, pt wants to know if that is normal to happen after that procedure. Please advise

## 2015-06-09 NOTE — Progress Notes (Signed)
cc'd to pcp 

## 2015-06-09 NOTE — Telephone Encounter (Signed)
I spoke with the pt- she said she is feeling much better today. She feels like it was just indigestion last night. She has no other complaints at this time.

## 2015-06-10 NOTE — Telephone Encounter (Signed)
Late entry- advised pt that if she continues to have problems she should call us or go to the ED if it worsens. She agreed with plan.

## 2015-06-17 NOTE — Telephone Encounter (Signed)
Please call pt. HER stomach Bx shows gastritis/ULCERS FROM ASPIRIN PRODUCTS.   STRICTLY AVOID ASPIRIN, BC/GOODY POWDERS, IBUPROFEN/MOTRIN, OR NAPROXEN/ALEVE BECAUSE YOU HAVE STOMACH ULCERS.  CONTINUE PROTONIX. TAKE 30 MINUTES PRIOR TO MEALS TWICE DAILY.  FOLLOW UP in MAY 2017.

## 2015-06-18 NOTE — Telephone Encounter (Signed)
Done

## 2015-06-18 NOTE — Telephone Encounter (Signed)
Pt is aware of results. 

## 2015-06-18 NOTE — Telephone Encounter (Signed)
NIC for FU in May

## 2015-07-08 ENCOUNTER — Ambulatory Visit (HOSPITAL_COMMUNITY): Payer: Self-pay | Admitting: Psychiatry

## 2015-07-29 ENCOUNTER — Telehealth: Payer: Self-pay | Admitting: Gastroenterology

## 2015-07-29 MED ORDER — PANTOPRAZOLE SODIUM 40 MG PO TBEC: 40.0000 mg | DELAYED_RELEASE_TABLET | Freq: Two times a day (BID) | ORAL | Status: DC

## 2015-07-29 NOTE — Telephone Encounter (Signed)
Completed.

## 2015-07-29 NOTE — Telephone Encounter (Signed)
[place] PATIENT REGARDING HER OMEPRAZOLE.  WAS PUT ON 2 PILLS DAILY AND HER PRESCRIPTION IS FOR ONE.

## 2015-07-29 NOTE — Addendum Note (Signed)
Addended by: Orvil Feil on: 07/29/2015 10:27 AM   Modules accepted: Orders

## 2015-07-29 NOTE — Telephone Encounter (Signed)
Pt is aware.  

## 2015-07-29 NOTE — Telephone Encounter (Signed)
I called pt and she needs the prescription for Protonix bid. Her PCP had given her one for qd and she needs it for bid.

## 2015-09-02 ENCOUNTER — Encounter (HOSPITAL_COMMUNITY): Payer: Self-pay | Admitting: Psychiatry

## 2015-09-02 ENCOUNTER — Ambulatory Visit (INDEPENDENT_AMBULATORY_CARE_PROVIDER_SITE_OTHER): Payer: Medicare PPO | Admitting: Psychiatry

## 2015-09-02 VITALS — BP 152/71 | HR 68 | Ht 63.0 in | Wt 133.2 lb

## 2015-09-02 DIAGNOSIS — F411 Generalized anxiety disorder: Secondary | ICD-10-CM | POA: Diagnosis not present

## 2015-09-02 MED ORDER — ALPRAZOLAM 0.5 MG PO TABS: 0.5000 mg | ORAL_TABLET | Freq: Three times a day (TID) | ORAL | Status: DC

## 2015-09-02 MED ORDER — SERTRALINE HCL 100 MG PO TABS: 200.0000 mg | ORAL_TABLET | Freq: Every day | ORAL | Status: DC

## 2015-09-02 NOTE — Progress Notes (Signed)
Psychiatric Initial Adult Assessment   Patient Identification: Cynthia Munoz MRN:  [id number] Date of Evaluation:  09/02/2015 Referral Source: [place] Chief Complaint:   Chief Complaint    Depression; Anxiety; Establish Care     Visit Diagnosis:    ICD-9-CM ICD-10-CM   1. Generalized anxiety disorder 300.02 F41.1    Diagnosis:   Patient Active Problem List   Diagnosis Date Noted  . PUD (peptic ulcer disease) [K27.9]   . Black stool [K92.1] 06/02/2015  . IDA (iron deficiency anemia) [D50.9] 07/29/2013  . Abdominal pain, epigastric [R10.13] 06/04/2013  . RUQ pain [R10.11] 06/04/2013  . Abnormal LFTs [R79.89] 06/04/2013  . Microcytic anemia [D50.9] 06/04/2013   History of Present Illness:  This patient is a 64 year old divorced white female who lives alone in [place]. She has 2 grown sons and 3 grandchildren. 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  The patient was referred by [facility] for further evaluation and treatment of depression and anxiety.  The patient states that she began getting depressed around 2000. 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 Working around the public made her extremely anxious and she began having significant panic attacks. She was started on Paxil which worked for a while but eventually stopped working and she is now on Zoloft 200 mg daily. At the time she was also very depressed and cried all the time and the Zoloft has helped this tremendously.  About 5 years ago she caught her husband having an affair with one of her friends after 54 years of marriage. They are now divorced and this was a difficult time for her as well. She states that she used to be on Xanax and it helped her anxiety and panic but she is not on this anymore. She does states that her nerves got worse after her nephew  died while working in the [employer]. Apparently he had fallen into a vat. She is very close to all her nieces and nephews and this was a hard blow for her.  Currently the patient states that she feels nervous and anxious all the time. She does go out some but avoids crowds. She does spend time with family members and attends church. She doesn't like dealing with a lot of people. She often feels nervous and anxious at home. She uses coloring books to help calm her down. Her sleep is variable, she's eating well. She denies psychotic symptoms like auditory or visual hallucinations or paranoia. She has never been suicidal and has had no previous psychiatric treatment. Elements:  Location:  Global. Quality:  Worsening. Severity:  Moderate. Timing:  Daily. Duration:  Several years. Context:  Recent death of nephew. Associated Signs/Symptoms: Depression Symptoms:  psychomotor agitation, difficulty concentrating, anxiety, panic attacks, loss of energy/fatigue,  Anxiety Symptoms:  Excessive Worry, Social Anxiety,   Past Medical History:  Past Medical History  Diagnosis Date  . Hypertension   . High cholesterol   . Depression   . Anxiety   . Skin cancer, basal cell   . GERD (gastroesophageal reflux disease)   . Shortness of breath   . COPD (chronic obstructive pulmonary disease)   . Hypertension     Past Surgical History  Procedure Laterality Date  . Cholecystectomy    . Back surgery    . Esophagogastroduodenoscopy  August  2013    Dr. Ria Bush Hou--> protocol.c reflux esophagitis. Nonbleeding gastric ulcers. Hiatal hernia. Biopsies negative for H. pylori or Barrett's esophagus. Followup EGD recommended but not done.  . Colonoscopy  August 2010    Dr. Barnabas Lister Spainhour--> sessile polyp removed from the rectum into from the sigmoid colon. Pathology: Hyperplastic   . Small bowel capsule endoscopy  October 2010    Dr. Barnabas Lister [place]hour--> normal.   . Colonoscopy with  esophagogastroduodenoscopy (egd) N/A 08/11/2013    Dr. Oneida Alar: 1. Normal mucosa in the terminal ileum. 2. Mild diveticulosois was noted inthe sigmoid colon.  EGD: 1. abd pain due to PUD 2. No obvious source for diarrhea identified. Negative sprue on duodenal biopsy  . Tubal ligation    . Esophagogastroduodenoscopy N/A 01/06/2014    Dr. Oneida Alar: 1. Small ulcer was found at the pylorus 2. Medium sized ulcer was found in the prepyloric region of the stomach  3. Mild duodenitis. in the second portion of the duodenum. Needs surveillance EGD but did not follow-up until 2016. Neg H.pylori  . Esophagogastroduodenoscopy N/A 06/03/2015    Procedure: ESOPHAGOGASTRODUODENOSCOPY (EGD);  Surgeon: Danie Binder, MD;  Location: [facility];  Service: Endoscopy;  Laterality: N/A;  215   Family History:  Family History  Problem Relation Age of Onset  . Colon cancer Paternal Aunt     greater >60  . Lung cancer Father   . Heart disease Father   . Liver disease Neg Hx   . Celiac disease Neg Hx   . Diverticulitis Sister   . Anxiety disorder Sister   . Depression Sister    Social History:   Social History   Social History  . Marital Status: Divorced    Spouse Name: N/A  . Number of Children: 2  . Years of Education: N/A   Social History Main Topics  . Smoking status: Current Every Day Smoker -- 0.50 packs/day for 38 years    Types: Cigarettes  . Smokeless tobacco: None     Comment: "trying to quit"   . Alcohol Use: No  . Drug Use: No  . Sexual Activity: Not Currently   Other Topics Concern  . None   Social History Narrative   Additional Social History: The patient grew up in [place] with both parents and 4 sisters. Her oldest sister is now deceased. She denies any history of trauma or abuse and states that she had a fairly good childhood. She left school in the ninth grade to get married. She worked in [employer] for many years until it closed. She was married for 41 years but is now  divorced. She is currently on disability  Musculoskeletal: Strength & Muscle Tone: within normal limits Gait & Station: normal Patient leans: N/A  Psychiatric Specialty Exam: HPI  Review of Systems  Gastrointestinal: Positive for heartburn.  Psychiatric/Behavioral: Positive for depression. The patient is nervous/anxious.   All other systems reviewed and are negative.   Blood pressure 152/71, pulse 68, height 5\' 3"  (1.6 m), weight 133 lb 3.2 oz (60.419 kg).Body mass index is 23.6 kg/(m^2).  General Appearance: Casual, Neat and Well Groomed  Eye Contact:  Good  Speech:  Clear and Coherent  Volume:  Normal  Mood:  Anxious  Affect:  Constricted  Thought Process:  Goal Directed  Orientation:  Full (Time, Place, and Person)  Thought Content:  Rumination  Suicidal Thoughts:  No  Homicidal Thoughts:  No  Memory:  Immediate;   Good Recent;  Fair Remote;   Fair  Judgement:  Fair  Insight:  Fair  Psychomotor Activity:  Normal  Concentration:  Good  Recall:  [place] of Knowledge:Fair  Language: Good  Akathisia:  No  Handed:  Right  AIMS (if indicated):    Assets:  Communication Skills Desire for Improvement Physical Health Resilience Social Support Talents/Skills  ADL's:  Intact  Cognition: WNL  Sleep:  good   Is the patient at risk to self?  No. Has the patient been a risk to self in the past 6 months?  No. Has the patient been a risk to self within the distant past?  No. Is the patient a risk to others?  No. Has the patient been a risk to others in the past 6 months?  No. Has the patient been a risk to others within the distant past?  No.  Allergies:  No Known Allergies Current Medications: Current Outpatient Prescriptions  Medication Sig Dispense Refill  . albuterol (PROVENTIL HFA;VENTOLIN HFA) 108 (90 BASE) MCG/ACT inhaler Inhale 2 puffs into the lungs every 6 (six) hours as needed. Shortness of breath    . lisinopril (PRINIVIL,ZESTRIL) 20 MG tablet Take 40 mg  by mouth daily.    Marland Kitchen NASONEX 50 MCG/ACT nasal spray Place 2 sprays into the nose daily.    . niacin 500 MG tablet Take 500 mg by mouth 2 (two) times daily.    . pantoprazole (PROTONIX) 40 MG tablet Take 1 tablet (40 mg total) by mouth 2 (two) times daily. 60 tablet 5  . pravastatin (PRAVACHOL) 40 MG tablet Take 80 mg by mouth at bedtime.    . sertraline (ZOLOFT) 100 MG tablet Take 2 tablets (200 mg total) by mouth daily. 60 tablet 2  . ALPRAZolam (XANAX) 0.5 MG tablet Take 1 tablet (0.5 mg total) by mouth 3 (three) times daily. 90 tablet 2   No current facility-administered medications for this visit.    Previous Psychotropic Medications: Yes   Substance Abuse History in the last 12 months:  No.  Consequences of Substance Abuse: NA  Medical Decision Making:  Review of Psycho-Social Stressors (1), Review or order clinical lab tests (1), Review and summation of old records (2), Established Problem, Worsening (2), Review of Medication Regimen & Side Effects (2) and Review of New Medication or Change in Dosage (2)  Treatment Plan Summary: Medication management   This patient is a 64 year old white female with a history of depression and anxiety. She feels like her depression is under fairly good control with the Zoloft but is having more frequent anxiety and panic attacks. I suggested counseling but she declines. She is agreeable to a trial of low-dose Xanax-0.5 mg 3 times a day. She will return in 6 weeks or call sooner if problems arise    [place], [facility] 9/8/201611:29 AM

## 2015-09-14 ENCOUNTER — Telehealth (HOSPITAL_COMMUNITY): Payer: Self-pay | Admitting: *Deleted

## 2015-09-14 NOTE — Telephone Encounter (Signed)
See if we can get her in sooner. Also schedule for counseling

## 2015-09-14 NOTE — Telephone Encounter (Signed)
Phone call from patient she is on Xanax and Zoloft, yesterday she was very depressed, don't want to do anything, don't want to see anybody.

## 2015-09-14 NOTE — Telephone Encounter (Signed)
lmtcb

## 2015-09-14 NOTE — Telephone Encounter (Signed)
lmtcb number provided 

## 2015-09-14 NOTE — Telephone Encounter (Signed)
Spoke with pt and she agreed to come in for sooner appt 09-15-15. Pt showed understanding

## 2015-09-15 ENCOUNTER — Encounter (HOSPITAL_COMMUNITY): Payer: Self-pay | Admitting: Psychiatry

## 2015-09-15 ENCOUNTER — Ambulatory Visit (INDEPENDENT_AMBULATORY_CARE_PROVIDER_SITE_OTHER): Payer: Medicare PPO | Admitting: Psychiatry

## 2015-09-15 VITALS — BP 132/80 | HR 83 | Ht 63.0 in | Wt 130.2 lb

## 2015-09-15 DIAGNOSIS — F411 Generalized anxiety disorder: Secondary | ICD-10-CM | POA: Diagnosis not present

## 2015-09-15 MED ORDER — SERTRALINE HCL 100 MG PO TABS: 200.0000 mg | ORAL_TABLET | Freq: Every day | ORAL | Status: DC

## 2015-09-15 MED ORDER — VILAZODONE HCL 40 MG PO TABS: 40.0000 mg | ORAL_TABLET | Freq: Every day | ORAL | Status: DC

## 2015-09-15 NOTE — Progress Notes (Signed)
Patient ID: Cynthia Munoz, female   DOB: 06-Jan-1951, 64 y.o.   MRN: [id number]  Psychiatric Initial Adult Assessment   Patient Identification: Cynthia Munoz MRN:  [id number] Date of Evaluation:  09/15/2015 Referral Source: [place] Chief Complaint:   Chief Complaint    Depression; Anxiety; Follow-up     Visit Diagnosis:    ICD-9-CM ICD-10-CM   1. Generalized anxiety disorder 300.02 F41.1    Diagnosis:   Patient Active Problem List   Diagnosis Date Noted  . PUD (peptic ulcer disease) [K27.9]   . Black stool [K92.1] 06/02/2015  . IDA (iron deficiency anemia) [D50.9] 07/29/2013  . Abdominal pain, epigastric [R10.13] 06/04/2013  . RUQ pain [R10.11] 06/04/2013  . Abnormal LFTs [R79.89] 06/04/2013  . Microcytic anemia [D50.9] 06/04/2013   History of Present Illness:  This patient is a 64 year old divorced white female who lives alone in [place]. She has 2 grown sons and 3 grandchildren. 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  The patient was referred by [facility] for further evaluation and treatment of depression and anxiety.  The patient states that she began getting depressed around 2000. 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 Working around the public made her extremely anxious and she began having significant panic attacks. She was started on Paxil which worked for a while but eventually stopped working and she is now on Zoloft 200 mg daily. At the time she was also very depressed and cried all the time and the Zoloft has helped this tremendously.  About 5 years ago she caught her husband having an affair with one of her friends after 63 years of marriage. They are now divorced and this was a difficult time for her as well. She states that she used to be on Xanax and it helped her anxiety and panic but she is not on  this anymore. She does states that her nerves got worse after her nephew died while working in the [employer]. Apparently he had fallen into a vat. She is very close to all her nieces and nephews and this was a hard blow for her.  Currently the patient states that she feels nervous and anxious all the time. She does go out some but avoids crowds. She does spend time with family members and attends church. She doesn't like dealing with a lot of people. She often feels nervous and anxious at home. She uses coloring books to help calm her down. Her sleep is variable, she's eating well. She denies psychotic symptoms like auditory or visual hallucinations or paranoia. She has never been suicidal and has had no previous psychiatric treatment.  The patient returns after 2 weeks as a work in today. She states that she spent all yesterday crying and felt very sad and depressed. She has been spending a lot of time alone. She is lonely since she no longer lives with either her husband or any of her sisters. She denies any manic symptoms or suicidal ideation. The Xanax is helping her anxiety but she really doesn't think that the Zoloft is working for her depression anymore. She has tried Paxil Cymbalta and Wellbutrin in the past with no success. I told her perhaps she needs to try one of the newer medications such as Viibryd Elements:  Location:  Global. Quality:  Worsening. Severity:  Moderate. Timing:  Daily. Duration:  Several years. Context:  Recent death of nephew. Associated Signs/Symptoms: Depression Symptoms:  psychomotor agitation, difficulty concentrating, anxiety, panic attacks, loss of energy/fatigue,  Anxiety Symptoms:  Excessive Worry, Social Anxiety,   Past Medical History:  Past Medical History  Diagnosis Date  . Hypertension   . High cholesterol   . Depression   . Anxiety   . Skin cancer, basal cell   . GERD (gastroesophageal reflux disease)   . Shortness of breath   . COPD  (chronic obstructive pulmonary disease)   . Hypertension     Past Surgical History  Procedure Laterality Date  . Cholecystectomy    . Back surgery    . Esophagogastroduodenoscopy  August 2013    Dr. Ria Bush Hou--> protocol.c reflux esophagitis. Nonbleeding gastric ulcers. Hiatal hernia. Biopsies negative for H. pylori or Barrett's esophagus. Followup EGD recommended but not done.  . Colonoscopy  August 2010    Dr. Barnabas Lister Spainhour--> sessile polyp removed from the rectum into from the sigmoid colon. Pathology: Hyperplastic   . Small bowel capsule endoscopy  October 2010    Dr. Barnabas Lister Spainhour--> normal.   . Colonoscopy with esophagogastroduodenoscopy (egd) N/A 08/11/2013    Dr. Oneida Alar: 1. Normal mucosa in the terminal ileum. 2. Mild diveticulosois was noted inthe sigmoid colon.  EGD: 1. abd pain due to PUD 2. No obvious source for diarrhea identified. Negative sprue on duodenal biopsy  . Tubal ligation    . Esophagogastroduodenoscopy N/A 01/06/2014    Dr. Oneida Alar: 1. Small ulcer was found at the pylorus 2. Medium sized ulcer was found in the prepyloric region of the stomach  3. Mild duodenitis. in the second portion of the duodenum. Needs surveillance EGD but did not follow-up until 2016. Neg H.pylori  . Esophagogastroduodenoscopy N/A 06/03/2015    Procedure: ESOPHAGOGASTRODUODENOSCOPY (EGD);  Surgeon: Danie Binder, MD;  [facility]: [facility];  Service: Endoscopy;  Laterality: N/A;  215   Family History:  Family History  Problem Relation Age of Onset  . Colon cancer Paternal Aunt     greater >60  . Lung cancer Father   . Heart disease Father   . Liver disease Neg Hx   . Celiac disease Neg Hx   . Diverticulitis Sister   . Anxiety disorder Sister   . Depression Sister    Social History:   Social History   Social History  . Marital Status: Divorced    Spouse Name: N/A  . Number of Children: 2  . Years of Education: N/A   Social History Main Topics  . Smoking status: Current Every  Day Smoker -- 0.50 packs/day for 38 years    Types: Cigarettes  . Smokeless tobacco: None     Comment: "trying to quit"   . Alcohol Use: No  . Drug Use: No  . Sexual Activity: Not Currently   Other Topics Concern  . None   Social History Narrative   Additional Social History: The patient grew up in [place] with both parents and 4 sisters. Her oldest sister is now deceased. She denies any history of trauma or abuse and states that she had a fairly good childhood. She left school in the ninth grade to get married. She worked in [employer] for many years until it closed. She was married for 57 years but is now divorced. She is currently on disability  Musculoskeletal: Strength & Muscle Tone: within normal limits Gait & Station: normal Patient leans: N/A  Psychiatric  Specialty Exam: Depression        Past medical history includes anxiety.   Anxiety Symptoms include nervous/anxious behavior.      Review of Systems  Gastrointestinal: Positive for heartburn.  Psychiatric/Behavioral: Positive for depression. The patient is nervous/anxious.   All other systems reviewed and are negative.   Blood pressure 132/80, pulse 83, height 5\' 3"  (1.6 m), weight 130 lb 3.2 oz (59.058 kg).Body mass index is 23.07 kg/(m^2).  General Appearance: Casual, Neat and Well Groomed  Eye Contact:  Good  Speech:  Clear and Coherent  Volume:  Normal  Mood:  Anxious depressed   Affect:  Constricted  Thought Process:  Goal Directed  Orientation:  Full (Time, Place, and Person)  Thought Content:  Rumination  Suicidal Thoughts:  No  Homicidal Thoughts:  No  Memory:  Immediate;   Good Recent;   Fair Remote;   Fair  Judgement:  Fair  Insight:  Fair  Psychomotor Activity:  Normal  Concentration:  Good  Recall:  [place] of Knowledge:Fair  Language: Good  Akathisia:  No  Handed:  Right  AIMS (if indicated):    Assets:  Communication Skills Desire for Improvement Physical  Health Resilience Social Support Talents/Skills  ADL's:  Intact  Cognition: WNL  Sleep:  good   Is the patient at risk to self?  No. Has the patient been a risk to self in the past 6 months?  No. Has the patient been a risk to self within the distant past?  No. Is the patient a risk to others?  No. Has the patient been a risk to others in the past 6 months?  No. Has the patient been a risk to others within the distant past?  No.  Allergies:  No Known Allergies Current Medications: Current Outpatient Prescriptions  Medication Sig Dispense Refill  . albuterol (PROVENTIL HFA;VENTOLIN HFA) 108 (90 BASE) MCG/ACT inhaler Inhale 2 puffs into the lungs every 6 (six) hours as needed. Shortness of breath    . ALPRAZolam (XANAX) 0.5 MG tablet Take 1 tablet (0.5 mg total) by mouth 3 (three) times daily. 90 tablet 2  . amLODipine (NORVASC) 10 MG tablet Take 10 mg by mouth daily.    Marland Kitchen lisinopril (PRINIVIL,ZESTRIL) 20 MG tablet Take 40 mg by mouth daily.    Marland Kitchen NASONEX 50 MCG/ACT nasal spray Place 2 sprays into the nose daily.    . niacin 500 MG tablet Take 500 mg by mouth 2 (two) times daily.    . pantoprazole (PROTONIX) 40 MG tablet Take 1 tablet (40 mg total) by mouth 2 (two) times daily. 60 tablet 5  . pravastatin (PRAVACHOL) 40 MG tablet Take 80 mg by mouth at bedtime.    . sertraline (ZOLOFT) 100 MG tablet Take 2 tablets (200 mg total) by mouth daily. 60 tablet 2  . Vilazodone HCl (VIIBRYD) 40 MG TABS Take 1 tablet (40 mg total) by mouth daily. 30 tablet 2   No current facility-administered medications for this visit.    Previous Psychotropic Medications: Yes   Substance Abuse History in the last 12 months:  No.  Consequences of Substance Abuse: NA  Medical Decision Making:  Review of Psycho-Social Stressors (1), Review or order clinical lab tests (1), Review and summation of old records (2), Established Problem, Worsening (2), Review of Medication Regimen & Side Effects (2) and Review  of New Medication or Change in Dosage (2)  Treatment Plan Summary: Medication management   This patient is a 64 year old  white female with a history of depression and anxiety. She feels like her antidepressant is no longer working. We will switch to Viibryd and get up to a dosage of 40 mg over the next month while she is gradually tapering off the Zoloft. She will continue Xanax for anxiety and return to see me in 4 weeks. She'll also be scheduled with a counselor    [place], [facility] 9/21/20161:53 PM

## 2015-09-21 ENCOUNTER — Telehealth (HOSPITAL_COMMUNITY): Payer: Self-pay | Admitting: *Deleted

## 2015-09-21 NOTE — Telephone Encounter (Signed)
Prior authorization received for Viibryd. Submitted online with cover my meds. Status approved case [id number], letter will be faxed to office

## 2015-09-22 NOTE — Telephone Encounter (Signed)
noted 

## 2015-09-22 NOTE — Telephone Encounter (Signed)
lmtcb

## 2015-09-23 NOTE — Telephone Encounter (Signed)
lmtcb

## 2015-10-14 ENCOUNTER — Ambulatory Visit (HOSPITAL_COMMUNITY): Payer: Self-pay | Admitting: Psychiatry

## 2015-10-18 ENCOUNTER — Ambulatory Visit (HOSPITAL_COMMUNITY): Payer: Self-pay | Admitting: Psychiatry

## 2015-10-27 ENCOUNTER — Telehealth: Payer: Self-pay | Admitting: Gastroenterology

## 2015-10-27 NOTE — Telephone Encounter (Signed)
PT last seen here by Laban Emperor, NP on 06/02/2015. She is having constipation, not had a good BM for a couple of weeks, just hard balls some. She has taken Miralax in the past and it helped some, please advise!

## 2015-10-27 NOTE — Telephone Encounter (Signed)
She can take Miralax daily to BID for now, but I would like to talk with her about her issues and have her start [place]. She will need an office visit for this. If she would like to pick up samples of Linzess 145 mcg, she can do that, but still needs an office visit to continue this.

## 2015-10-27 NOTE — Telephone Encounter (Signed)
Tried to call. VM not set up.

## 2015-10-27 NOTE — Telephone Encounter (Signed)
Patient is having problems going to the bathroom and would like something called in or advise something OTC for her. She uses [facility] in [place], [phone]

## 2015-10-28 ENCOUNTER — Encounter: Payer: Self-pay | Admitting: Gastroenterology

## 2015-10-28 NOTE — Telephone Encounter (Signed)
I called pt and she said she will try Miralax, but it has made her sick on the stomach in the past. She would like to try the Linzess 145 mcg qd. Samples #12 given ( at front for pick up).  She is aware her appt is 11/12/2015 at 9:00 Am with AS.

## 2015-10-28 NOTE — Telephone Encounter (Signed)
OV made and letter mailed °

## 2015-10-29 ENCOUNTER — Ambulatory Visit (INDEPENDENT_AMBULATORY_CARE_PROVIDER_SITE_OTHER): Payer: Medicare PPO | Admitting: Psychiatry

## 2015-10-29 ENCOUNTER — Encounter (HOSPITAL_COMMUNITY): Payer: Self-pay | Admitting: Psychiatry

## 2015-10-29 VITALS — BP 137/66 | HR 80 | Ht 62.0 in | Wt 136.2 lb

## 2015-10-29 DIAGNOSIS — F411 Generalized anxiety disorder: Secondary | ICD-10-CM

## 2015-10-29 MED ORDER — VILAZODONE HCL 40 MG PO TABS: 40.0000 mg | ORAL_TABLET | Freq: Every day | ORAL | Status: DC

## 2015-10-29 MED ORDER — ALPRAZOLAM 0.5 MG PO TABS: 0.5000 mg | ORAL_TABLET | Freq: Three times a day (TID) | ORAL | Status: DC

## 2015-10-29 NOTE — Progress Notes (Signed)
Patient ID: Cynthia Munoz, female   DOB: 10/15/1951, 64 y.o.   MRN: [id number] Patient ID: Cynthia Munoz, female   DOB: [birth year], 64 y.o.   MRN: [id number]  Psychiatric  Adult follow up  Patient Identification: Cynthia Munoz MRN:  [id number] Date of Evaluation:  10/29/2015 Referral Source: [place] Chief Complaint:   Chief Complaint    Depression; Anxiety; Follow-up     Visit Diagnosis:    ICD-9-CM ICD-10-CM   1. Generalized anxiety disorder 300.02 F41.1    Diagnosis:   Patient Active Problem List   Diagnosis Date Noted  . PUD (peptic ulcer disease) [K27.9]   . Black stool [K92.1] 06/02/2015  . IDA (iron deficiency anemia) [D50.9] 07/29/2013  . Abdominal pain, epigastric [R10.13] 06/04/2013  . RUQ pain [R10.11] 06/04/2013  . Abnormal LFTs [R79.89] 06/04/2013  . Microcytic anemia [D50.9] 06/04/2013   History of Present Illness:  This patient is a 64 year old divorced white female who lives alone in [place]. She has 2 grown sons and 3 grandchildren. 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  The patient was referred by [facility] for further evaluation and treatment of depression and anxiety.  The patient states that she began getting depressed around 2000. 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 Working around the public made her extremely anxious and she began having significant panic attacks. She was started on Paxil which worked for a while but eventually stopped working and she is now on Zoloft 200 mg daily. At the time she was also very depressed and cried all the time and the Zoloft has helped this tremendously.  About 5 years ago she caught her husband having an affair with one of her friends after 92 years of marriage. They are now divorced and this was a difficult time for her as well. She states that she  used to be on Xanax and it helped her anxiety and panic but she is not on this anymore. She does states that her nerves got worse after her nephew died while working in the [employer]. Apparently he had fallen into a vat. She is very close to all her nieces and nephews and this was a hard blow for her.  Currently the patient states that she feels nervous and anxious all the time. She does go out some but avoids crowds. She does spend time with family members and attends church. She doesn't like dealing with a lot of people. She often feels nervous and anxious at home. She uses coloring books to help calm her down. Her sleep is variable, she's eating well. She denies psychotic symptoms like auditory or visual hallucinations or paranoia. She has never been suicidal and has had no previous psychiatric treatment.  The patient returns after 6 weeks. Last time she was changed from Zoloft to Start because she stated she was still depressed. She seems to be doing better on Viibryd. Her mood is brighter and she is getting out more with people. The Xanax is helping her anxiety except that she gets frustrated with her dog who won't mind her commands. I strongly suggested she get into a lot of dog training program. Overall however her affect is brighter and she is happy with her current regimen Elements:  Location:  Global. Quality:  Worsening. Severity:  Moderate. Timing:  Daily.  Duration:  Several years. Context:  Recent death of nephew. Associated Signs/Symptoms: Depression Symptoms:  psychomotor agitation, difficulty concentrating, anxiety, panic attacks, loss of energy/fatigue,  Anxiety Symptoms:  Excessive Worry, Social Anxiety,   Past Medical History:  Past Medical History  Diagnosis Date  . Hypertension   . High cholesterol   . Depression   . Anxiety   . Skin cancer, basal cell   . GERD (gastroesophageal reflux disease)   . Shortness of breath   . COPD (chronic obstructive pulmonary  disease) ([place])   . Hypertension     Past Surgical History  Procedure Laterality Date  . Cholecystectomy    . Back surgery    . Esophagogastroduodenoscopy  August 2013    Dr. Ria Bush Hou--> protocol.c reflux esophagitis. Nonbleeding gastric ulcers. Hiatal hernia. Biopsies negative for H. pylori or Barrett's esophagus. Followup EGD recommended but not done.  . Colonoscopy  August 2010    Dr. Barnabas Lister Spainhour--> sessile polyp removed from the rectum into from the sigmoid colon. Pathology: Hyperplastic   . Small bowel capsule endoscopy  October 2010    Dr. Barnabas Lister Spainhour--> normal.   . Colonoscopy with esophagogastroduodenoscopy (egd) N/A 08/11/2013    Dr. Oneida Alar: 1. Normal mucosa in the terminal ileum. 2. Mild diveticulosois was noted inthe sigmoid colon.  EGD: 1. abd pain due to PUD 2. No obvious source for diarrhea identified. Negative sprue on duodenal biopsy  . Tubal ligation    . Esophagogastroduodenoscopy N/A 01/06/2014    Dr. Oneida Alar: 1. Small ulcer was found at the pylorus 2. Medium sized ulcer was found in the prepyloric region of the stomach  3. Mild duodenitis. in the second portion of the duodenum. Needs surveillance EGD but did not follow-up until 2016. Neg H.pylori  . Esophagogastroduodenoscopy N/A 06/03/2015    Procedure: ESOPHAGOGASTRODUODENOSCOPY (EGD);  Surgeon: Danie Binder, MD;  [facility];  Service: Endoscopy;  Laterality: N/A;  215   Family History:  Family History  Problem Relation Age of Onset  . Colon cancer Paternal Aunt     greater >60  . Lung cancer Father   . Heart disease Father   . Liver disease Neg Hx   . Celiac disease Neg Hx   . Diverticulitis Sister   . Anxiety disorder Sister   . Depression Sister    Social History:   Social History   Social History  . Marital Status: Divorced    Spouse Name: N/A  . Number of Children: 2  . Years of Education: N/A   Social History Main Topics  . Smoking status: Current Every Day Smoker -- 0.50  packs/day for 38 years    Types: Cigarettes  . Smokeless tobacco: None     Comment: "trying to quit"   . Alcohol Use: No  . Drug Use: No  . Sexual Activity: Not Currently   Other Topics Concern  . None   Social History Narrative   Additional Social History: The patient grew up in [place] with both parents and 4 sisters. Her oldest sister is now deceased. She denies any history of trauma or abuse and states that she had a fairly good childhood. She left school in the ninth grade to get married. She worked in [employer] for many years until it closed. She was married for 75 years but is now divorced. She is currently on disability  Musculoskeletal: Strength & Muscle Tone: within normal limits Gait & Station: normal Patient leans: N/A  Psychiatric Specialty Exam: Depression  Past medical history includes anxiety.   Anxiety Symptoms include nervous/anxious behavior.      Review of Systems  Gastrointestinal: Positive for heartburn.  Psychiatric/Behavioral: Positive for depression. The patient is nervous/anxious.   All other systems reviewed and are negative.   Blood pressure 137/66, pulse 80, height 5\' 2"  (1.575 m), weight 136 lb 3.2 oz (61.78 kg), SpO2 95 %.Body mass index is 24.91 kg/(m^2).  General Appearance: Casual, Neat and Well Groomed  Eye Contact:  Good  Speech:  Clear and Coherent  Volume:  Normal  Mood:  good  Affect:  Brighter   Thought Process:  Goal Directed  Orientation:  Full (Time, Place, and Person)  Thought Content:  Rumination  Suicidal Thoughts:  No  Homicidal Thoughts:  No  Memory:  Immediate;   Good Recent;   Fair Remote;   Fair  Judgement:  Fair  Insight:  Fair  Psychomotor Activity:  Normal  Concentration:  Good  Recall:  [place] of Knowledge:Fair  Language: Good  Akathisia:  No  Handed:  Right  AIMS (if indicated):    Assets:  Communication Skills Desire for Improvement Physical Health Resilience Social  Support Talents/Skills  ADL's:  Intact  Cognition: WNL  Sleep:  good   Is the patient at risk to self?  No. Has the patient been a risk to self in the past 6 months?  No. Has the patient been a risk to self within the distant past?  No. Is the patient a risk to others?  No. Has the patient been a risk to others in the past 6 months?  No. Has the patient been a risk to others within the distant past?  No.  Allergies:  No Known Allergies Current Medications: Current Outpatient Prescriptions  Medication Sig Dispense Refill  . albuterol (PROVENTIL HFA;VENTOLIN HFA) 108 (90 BASE) MCG/ACT inhaler Inhale 2 puffs into the lungs every 6 (six) hours as needed. Shortness of breath    . ALPRAZolam (XANAX) 0.5 MG tablet Take 1 tablet (0.5 mg total) by mouth 3 (three) times daily. 90 tablet 2  . amLODipine (NORVASC) 10 MG tablet Take 10 mg by mouth daily.    Marland Kitchen lisinopril (PRINIVIL,ZESTRIL) 20 MG tablet Take 40 mg by mouth daily.    Marland Kitchen NASONEX 50 MCG/ACT nasal spray Place 2 sprays into the nose daily.    . niacin 500 MG tablet Take 500 mg by mouth 2 (two) times daily.    . pantoprazole (PROTONIX) 40 MG tablet Take 1 tablet (40 mg total) by mouth 2 (two) times daily. 60 tablet 5  . pravastatin (PRAVACHOL) 40 MG tablet Take 80 mg by mouth at bedtime.    . Vilazodone HCl (VIIBRYD) 40 MG TABS Take 1 tablet (40 mg total) by mouth daily. 30 tablet 2   No current facility-administered medications for this visit.    Previous Psychotropic Medications: Yes   Substance Abuse History in the last 12 months:  No.  Consequences of Substance Abuse: NA  Medical Decision Making:  Review of Psycho-Social Stressors (1), Review or order clinical lab tests (1), Review and summation of old records (2), Established Problem, Worsening (2), Review of Medication Regimen & Side Effects (2) and Review of New Medication or Change in Dosage (2)  Treatment Plan Summary: Medication management   The patient will continue  Viibryd for depression and Xanax for anxiety. She'll return in 3 months    Hayat [place], [place] 11/4/20162:10 PM

## 2015-11-12 ENCOUNTER — Ambulatory Visit: Payer: Self-pay | Admitting: Gastroenterology

## 2015-11-23 ENCOUNTER — Telehealth: Payer: Self-pay

## 2015-11-23 ENCOUNTER — Other Ambulatory Visit: Payer: Self-pay

## 2015-11-23 MED ORDER — LINACLOTIDE 145 MCG PO CAPS: 145.0000 ug | ORAL_CAPSULE | Freq: Every day | ORAL | Status: DC

## 2015-11-23 NOTE — Telephone Encounter (Signed)
I sent in one month supply. Needs OV with [place] before any more refills.

## 2015-11-23 NOTE — Telephone Encounter (Signed)
Pt called and is requesting a RX for the [place]. She is almost out of the samples that were provided in the office. States they are working

## 2015-11-24 NOTE — Telephone Encounter (Signed)
PATIENT SCHEDULED  °

## 2015-12-15 ENCOUNTER — Telehealth (HOSPITAL_COMMUNITY): Payer: Self-pay | Admitting: *Deleted

## 2015-12-15 NOTE — Telephone Encounter (Signed)
phone call from patient, she wants to know if she can take two of the Viibryd instead of one.  She got news that her youngest sister has cancer.

## 2015-12-16 NOTE — Telephone Encounter (Signed)
Met with Dr. Doyne Keel to inform of patient's request to increase Viibryd and Dr. Doyne Keel reported this is something patient would have to wait and discuss with Dr.Ross upon her return as she would not authorize an increase at this time.  Left patient a voice message of Dr. Havery Moros denial for an increase but instruction to contact Dr. Harrington Challenger upon her return to discuss.

## 2015-12-21 NOTE — Telephone Encounter (Signed)
noted 

## 2015-12-24 ENCOUNTER — Telehealth (HOSPITAL_COMMUNITY): Payer: Self-pay | Admitting: *Deleted

## 2015-12-24 NOTE — Telephone Encounter (Signed)
voice message from patient request phone call from [place].  Patient said she sits in the dark and cries all the time.   She don't want to do nothing, don't want  to get up.

## 2015-12-24 NOTE — Telephone Encounter (Signed)
Please call her to see of she needs to come in sooner

## 2015-12-24 NOTE — Telephone Encounter (Signed)
Called pt and made appt for pt to come to office on Jan 3rd. Pt agreed with appt. Informed pt if she feels like it's an emergency this weekend to go to the emergency room and pt agreed and showed understanding.

## 2015-12-28 ENCOUNTER — Telehealth (HOSPITAL_COMMUNITY): Payer: Self-pay | Admitting: *Deleted

## 2015-12-28 ENCOUNTER — Ambulatory Visit (HOSPITAL_COMMUNITY): Payer: Self-pay | Admitting: Psychiatry

## 2015-12-28 NOTE — Telephone Encounter (Signed)
Pt called stating that she have not been crying for the last 3 days and feel a little better. Per pt, she is trying to do things a little. Per pt, she would like to cancel her appt for 12-28-15. Per pt, she will call office if anything changes.

## 2016-01-03 ENCOUNTER — Ambulatory Visit: Payer: Medicare PPO | Admitting: Gastroenterology

## 2016-01-06 ENCOUNTER — Telehealth (HOSPITAL_COMMUNITY): Payer: Self-pay | Admitting: *Deleted

## 2016-01-06 NOTE — Telephone Encounter (Signed)
lmtcb number provided 

## 2016-01-06 NOTE — Telephone Encounter (Signed)
phone call from patient regarding her Viibryd.     she said she has been off it for a week.

## 2016-01-06 NOTE — Telephone Encounter (Signed)
Spoke with pt and informed her that her medication should not be out of refills. Called pt pharmacy and they stated that the 10-29-15 script that was sent to them was on hold. Per pt pharmacy, they will get pt medication ready for her to pick up. Informed pt that pharmacy will get her medication refilled and to give them about an hour to get it filled and pt showed understanding.

## 2016-01-28 ENCOUNTER — Ambulatory Visit (INDEPENDENT_AMBULATORY_CARE_PROVIDER_SITE_OTHER): Payer: Medicare FFS | Admitting: Psychiatry

## 2016-01-28 ENCOUNTER — Encounter (HOSPITAL_COMMUNITY): Payer: Self-pay | Admitting: Psychiatry

## 2016-01-28 VITALS — BP 159/86 | HR 73 | Ht 62.0 in | Wt 142.6 lb

## 2016-01-28 DIAGNOSIS — F411 Generalized anxiety disorder: Secondary | ICD-10-CM

## 2016-01-28 MED ORDER — SERTRALINE HCL 100 MG PO TABS: 100.0000 mg | ORAL_TABLET | Freq: Every day | ORAL | Status: DC

## 2016-01-28 MED ORDER — ALPRAZOLAM 0.5 MG PO TABS: 0.5000 mg | ORAL_TABLET | Freq: Three times a day (TID) | ORAL | Status: DC

## 2016-01-28 NOTE — Progress Notes (Signed)
Patient ID: Cynthia Munoz, female   DOB: 1951-05-23, 65 y.o.   MRN: [id number] Patient ID: Cynthia Munoz, female   DOB: 03/22/1951, 65 y.o.   MRN: [id number] Patient ID: Cynthia Munoz, female   DOB: Dec 12, 1951, 65 y.o.   MRN: [id number]  Psychiatric  Adult follow up  Patient Identification: Cynthia Munoz MRN:  [id number] Date of Evaluation:  01/28/2016 Referral Source: [place] Chief Complaint:   Chief Complaint    Depression; Anxiety; Follow-up     Visit Diagnosis:    ICD-9-CM ICD-10-CM   1. Generalized anxiety disorder 300.02 F41.1    Diagnosis:   Patient Active Problem List   Diagnosis Date Noted  . PUD (peptic ulcer disease) [K27.9]   . Black stool [K92.1] 06/02/2015  . IDA (iron deficiency anemia) [D50.9] 07/29/2013  . Abdominal pain, epigastric [R10.13] 06/04/2013  . RUQ pain [R10.11] 06/04/2013  . Abnormal LFTs [R79.89] 06/04/2013  . Microcytic anemia [D50.9] 06/04/2013   History of Present Illness:  This patient is a 65 year old divorced white female who lives alone in [place]. She has 2 grown sons and 3 grandchildren. 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  The patient was referred by [facility] for further evaluation and treatment of depression and anxiety.  The patient states that she began getting depressed around 2000. 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 Working around the public made her extremely anxious and she began having significant panic attacks. She was started on Paxil which worked for a while but eventually stopped working and she is now on Zoloft 200 mg daily. At the time she was also very depressed and cried all the time and the Zoloft has helped this tremendously.  About 5 years ago she caught her husband having an affair with one of her friends after 43 years of marriage. They are now  divorced and this was a difficult time for her as well. She states that she used to be on Xanax and it helped her anxiety and panic but she is not on this anymore. She does states that her nerves got worse after her nephew died while working in the [employer]. Apparently he had fallen into a vat. She is very close to all her nieces and nephews and this was a hard blow for her.  Currently the patient states that she feels nervous and anxious all the time. She does go out some but avoids crowds. She does spend time with family members and attends church. She doesn't like dealing with a lot of people. She often feels nervous and anxious at home. She uses coloring books to help calm her down. Her sleep is variable, she's eating well. She denies psychotic symptoms like auditory or visual hallucinations or paranoia. She has never been suicidal and has had no previous psychiatric treatment.  The patient returns after 3 months. She was doing well on Viibryd for a while but lately she doesn't feel like it's helped lately. She stays anxious and irritable all the time. She ran out of the [place] for a while and went back to Zoloft and actually felt like she was doing better. She recently found out that her sister has lymphoma and she is very worried about this. I suggested we go back to the Zoloft and she agrees Elements:  Location:  Global. Quality:  Worsening. Severity:  Moderate. Timing:  Daily. Duration:  Several years. Context:  Recent death of nephew. Associated Signs/Symptoms: Depression Symptoms:  psychomotor agitation, difficulty concentrating, anxiety, panic attacks, loss of energy/fatigue,  Anxiety Symptoms:  Excessive Worry, Social Anxiety,   Past Medical History:  Past Medical History  Diagnosis Date  . Hypertension   . High cholesterol   . Depression   . Anxiety   . Skin cancer, basal cell   . GERD (gastroesophageal reflux disease)   . Shortness of breath   . COPD (chronic  obstructive pulmonary disease) ([place])   . Hypertension     Past Surgical History  Procedure Laterality Date  . Cholecystectomy    . Back surgery    . Esophagogastroduodenoscopy  August 2013    Dr. Ria Bush Hou--> protocol.c reflux esophagitis. Nonbleeding gastric ulcers. Hiatal hernia. Biopsies negative for H. pylori or Barrett's esophagus. Followup EGD recommended but not done.  . Colonoscopy  August 2010    Dr. Barnabas Lister Spainhour--> sessile polyp removed from the rectum into from the sigmoid colon. Pathology: Hyperplastic   . Small bowel capsule endoscopy  October 2010    Dr. Barnabas Lister Spainhour--> normal.   . Colonoscopy with esophagogastroduodenoscopy (egd) N/A 08/11/2013    Dr. Oneida Alar: 1. Normal mucosa in the terminal ileum. 2. Mild diveticulosois was noted inthe sigmoid colon.  EGD: 1. abd pain due to PUD 2. No obvious source for diarrhea identified. Negative sprue on duodenal biopsy  . Tubal ligation    . Esophagogastroduodenoscopy N/A 01/06/2014    Dr. Oneida Alar: 1. Small ulcer was found at the pylorus 2. Medium sized ulcer was found in the prepyloric region of the stomach  3. Mild duodenitis. in the second portion of the duodenum. Needs surveillance EGD but did not follow-up until 2016. Neg H.pylori  . Esophagogastroduodenoscopy N/A 06/03/2015    [id number] gastric ulcers due to NSAID   Family History:  Family History  Problem Relation Age of Onset  . Colon cancer Paternal Aunt     greater >60  . Lung cancer Father   . Heart disease Father   . Liver disease Neg Hx   . Celiac disease Neg Hx   . Diverticulitis Sister   . Anxiety disorder Sister   . Depression Sister    Social History:   Social History   Social History  . Marital Status: Divorced    Spouse Name: N/A  . Number of Children: 2  . Years of Education: N/A   Social History Main Topics  . Smoking status: Current Every Day Smoker -- 0.50 packs/day for 38 years    Types: Cigarettes  . Smokeless tobacco: None     Comment:  "trying to quit"   . Alcohol Use: No  . Drug Use: No  . Sexual Activity: Not Currently   Other Topics Concern  . None   Social History Narrative   Additional Social History: The patient grew up in [place] with both parents and 4 sisters. Her oldest sister is now deceased. She denies any history of trauma or abuse and states that she had a fairly good childhood. She left school in the ninth grade to get married. She worked in [employer] for many years until it closed. She was married for 107 years but is now divorced. She is currently on disability  Musculoskeletal: Strength & Muscle Tone: within normal limits Gait & Station: normal Patient leans: N/A  Psychiatric Specialty Exam: Depression  Past medical history includes anxiety.   Anxiety Symptoms include nervous/anxious behavior.      Review of Systems  Gastrointestinal: Positive for heartburn.  Psychiatric/Behavioral: Positive for depression. The patient is nervous/anxious.   All other systems reviewed and are negative.   Blood pressure 159/86, pulse 73, height 5\' 2"  (1.575 m), weight 142 lb 9.6 oz (64.683 kg), SpO2 96 %.Body mass index is 26.08 kg/(m^2).  General Appearance: Casual, Neat and Well Groomed  Eye Contact:  Good  Speech:  Clear and Coherent  Volume:  Normal  Mood: Anxious   Affect:  Congruent   Thought Process:  Goal Directed  Orientation:  Full (Time, Place, and Person)  Thought Content:  Rumination  Suicidal Thoughts:  No  Homicidal Thoughts:  No  Memory:  Immediate;   Good Recent;   Fair Remote;   Fair  Judgement:  Fair  Insight:  Fair  Psychomotor Activity:  Normal  Concentration:  Good  Recall:  [place] of Knowledge:Fair  Language: Good  Akathisia:  No  Handed:  Right  AIMS (if indicated):    Assets:  Communication Skills Desire for Improvement Physical Health Resilience Social Support Talents/Skills  ADL's:  Intact  Cognition: WNL  Sleep:  good   Is the patient at risk  to self?  No. Has the patient been a risk to self in the past 6 months?  No. Has the patient been a risk to self within the distant past?  No. Is the patient a risk to others?  No. Has the patient been a risk to others in the past 6 months?  No. Has the patient been a risk to others within the distant past?  No.  Allergies:  No Known Allergies Current Medications: Current Outpatient Prescriptions  Medication Sig Dispense Refill  . albuterol (PROVENTIL HFA;VENTOLIN HFA) 108 (90 BASE) MCG/ACT inhaler Inhale 2 puffs into the lungs every 6 (six) hours as needed. Shortness of breath    . ALPRAZolam (XANAX) 0.5 MG tablet Take 1 tablet (0.5 mg total) by mouth 3 (three) times daily. 90 tablet 2  . amLODipine (NORVASC) 10 MG tablet Take 10 mg by mouth daily.    . Linaclotide (LINZESS) 145 MCG CAPS capsule Take 1 capsule (145 mcg total) by mouth daily. 30 capsule 0  . lisinopril (PRINIVIL,ZESTRIL) 20 MG tablet Take 40 mg by mouth daily.    Marland Kitchen NASONEX 50 MCG/ACT nasal spray Place 2 sprays into the nose daily.    . niacin 500 MG tablet Take 500 mg by mouth 2 (two) times daily.    . pantoprazole (PROTONIX) 40 MG tablet Take 1 tablet (40 mg total) by mouth 2 (two) times daily. 60 tablet 5  . pravastatin (PRAVACHOL) 40 MG tablet Take 80 mg by mouth at bedtime.    . sertraline (ZOLOFT) 100 MG tablet Take 1 tablet (100 mg total) by mouth daily. 30 tablet 2   No current facility-administered medications for this visit.    Previous Psychotropic Medications: Yes   Substance Abuse History in the last 12 months:  No.  Consequences of Substance Abuse: NA  Medical Decision Making:  Review of Psycho-Social Stressors (1), Review or order clinical lab tests (1), Review and summation of old records (2), Established Problem, Worsening (2), Review of Medication Regimen & Side Effects (2) and Review of New Medication or Change in Dosage (2)  Treatment Plan Summary: Medication management   The patient will stop  the Viibryd and return to Zoloft 100 mg daily  for depression. She'll continue Xanax 0.5 mill grams 3 times a day for anxiety. She'll return in 6 weeks    Genna Casimir, [facility] 2/3/201710:07 AM

## 2016-02-02 ENCOUNTER — Other Ambulatory Visit: Payer: Self-pay | Admitting: Gastroenterology

## 2016-02-08 ENCOUNTER — Telehealth (HOSPITAL_COMMUNITY): Payer: Self-pay | Admitting: *Deleted

## 2016-02-08 NOTE — Telephone Encounter (Signed)
If she is suicidal she needs to go to the Ed. Is she seeing a counselor? She will need to come in sooner

## 2016-02-08 NOTE — Telephone Encounter (Signed)
Pt called stating she just don't want to talk to anyone or see anyone. Per pt, she stay ill all the time and don't know if it's mood swings or not. Per pt, she stays ill with herself all the time. On Zoloft and Xanax 0.5mg  and she don't know what's working or what's not. About ready to just give up on it and say for get it. Sometimes she feels like she could hurt herself and try to get it out of her head and is alone when she have those thoughts. [phone].

## 2016-02-08 NOTE — Telephone Encounter (Signed)
Called pt to inform her of what provider stated. Pt made appt with a counselor for Feb 15th and she did not make sooner appt with provider.

## 2016-02-09 ENCOUNTER — Ambulatory Visit (HOSPITAL_COMMUNITY): Payer: Medicare FFS | Admitting: Psychology

## 2016-02-23 ENCOUNTER — Telehealth (HOSPITAL_COMMUNITY): Payer: Self-pay | Admitting: *Deleted

## 2016-02-23 NOTE — Telephone Encounter (Signed)
Phone call from patient to cancel appointment with Dr. Harrington Challenger.  She spoke with [facility] and they scheduled her with an in network  provider in [place].

## 2016-02-23 NOTE — Telephone Encounter (Signed)
ok 

## 2016-02-24 ENCOUNTER — Ambulatory Visit: Payer: Medicare FFS | Admitting: Gastroenterology

## 2016-03-10 ENCOUNTER — Ambulatory Visit: Payer: Medicare FFS | Admitting: Gastroenterology

## 2016-03-10 ENCOUNTER — Ambulatory Visit (HOSPITAL_COMMUNITY): Payer: Self-pay | Admitting: Psychiatry

## 2016-03-13 ENCOUNTER — Ambulatory Visit (HOSPITAL_COMMUNITY): Payer: Self-pay | Admitting: Psychology

## 2016-03-27 ENCOUNTER — Ambulatory Visit (INDEPENDENT_AMBULATORY_CARE_PROVIDER_SITE_OTHER): Payer: Medicare FFS | Admitting: Gastroenterology

## 2016-03-27 ENCOUNTER — Encounter: Payer: Self-pay | Admitting: Gastroenterology

## 2016-03-27 ENCOUNTER — Telehealth (HOSPITAL_COMMUNITY): Payer: Self-pay | Admitting: *Deleted

## 2016-03-27 VITALS — BP 117/63 | HR 71 | Temp 97.4°F | Ht 63.0 in | Wt 139.6 lb

## 2016-03-27 DIAGNOSIS — K279 Peptic ulcer, site unspecified, unspecified as acute or chronic, without hemorrhage or perforation: Secondary | ICD-10-CM

## 2016-03-27 DIAGNOSIS — K5900 Constipation, unspecified: Secondary | ICD-10-CM

## 2016-03-27 MED ORDER — PANTOPRAZOLE SODIUM 40 MG PO TBEC: 40.0000 mg | DELAYED_RELEASE_TABLET | Freq: Two times a day (BID) | ORAL | Status: DC

## 2016-03-27 NOTE — Progress Notes (Signed)
Referring Provider: [facility] Primary Care Physician:  [facility]  Primary GI: Dr. Oneida Alar   Chief Complaint  Patient presents with  . Follow-up    HPI:   Cynthia Munoz is a 65 y.o. female presenting today with a history of PUD, IDA, chronic NSAID use. Historically has used BC powders, Voltaren. Last EGD in June 2016 with persistent gastric ulcers due to NSAIDs. Chronic constipation.   Sometimes stool is hard, sometimes loose. Sometimes unproductive. Feels like linzess is too strong. Has not been taking linzess routinely. Feels mild constipation intermittently. No abdominal pain. No N/V. BM usually about every morning. No fiber. Protonix BID. No rectal bleeding.   Past Medical History  Diagnosis Date  . Hypertension   . High cholesterol   . Depression   . Anxiety   . Skin cancer, basal cell   . GERD (gastroesophageal reflux disease)   . Shortness of breath   . COPD (chronic obstructive pulmonary disease) ([place])   . Hypertension     Past Surgical History  Procedure Laterality Date  . Cholecystectomy    . Back surgery    . Esophagogastroduodenoscopy  August 2013    Dr. Ria Bush Hou--> protocol.c reflux esophagitis. Nonbleeding gastric ulcers. Hiatal hernia. Biopsies negative for H. pylori or Barrett's esophagus. Followup EGD recommended but not done.  . Colonoscopy  August 2010    Dr. Barnabas Lister [place]hour--> sessile polyp removed from the rectum into from the sigmoid colon. Pathology: Hyperplastic   . Small bowel capsule endoscopy  October 2010    Dr. Barnabas Lister [place]hour--> normal.   . Colonoscopy with esophagogastroduodenoscopy (egd) N/A 08/11/2013    Dr. Oneida Alar: 1. Normal mucosa in the terminal ileum. 2. Mild diveticulosois was noted inthe sigmoid colon.  EGD: 1. abd pain due to PUD 2. No obvious source for diarrhea identified. Negative sprue on duodenal biopsy  . Tubal ligation    . Esophagogastroduodenoscopy N/A 01/06/2014    Dr. Oneida Alar: 1. Small  ulcer was found at the pylorus 2. Medium sized ulcer was found in the prepyloric region of the stomach  3. Mild duodenitis. in the second portion of the duodenum. Needs surveillance EGD but did not follow-up until 2016. Neg H.pylori  . Esophagogastroduodenoscopy N/A 06/03/2015    Dr. Fields:persistent gastric ulcers due to NSAIDs. No need for surveillance as she has continued to have EGDs noting NSAID-induced ulcers and continues to use NSAIDs.     Current Outpatient Prescriptions  Medication Sig Dispense Refill  . albuterol (PROVENTIL HFA;VENTOLIN HFA) 108 (90 BASE) MCG/ACT inhaler Inhale 2 puffs into the lungs every 6 (six) hours as needed. Shortness of breath    . ALPRAZolam (XANAX) 0.5 MG tablet Take 1 tablet (0.5 mg total) by mouth 3 (three) times daily. 90 tablet 2  . amLODipine (NORVASC) 10 MG tablet Take 10 mg by mouth daily.    . Linaclotide (LINZESS) 145 MCG CAPS capsule Take 1 capsule (145 mcg total) by mouth daily. 30 capsule 0  . lisinopril (PRINIVIL,ZESTRIL) 20 MG tablet Take 40 mg by mouth daily.    Marland Kitchen NASONEX 50 MCG/ACT nasal spray Place 2 sprays into the nose daily.    . niacin 500 MG tablet Take 500 mg by mouth 2 (two) times daily.    . pantoprazole (PROTONIX) 40 MG tablet TAKE ONE TABLET BY MOUTH TWICE DAILY 60 tablet 3  . pravastatin (PRAVACHOL) 40 MG tablet Take 80 mg by mouth at bedtime.    . sertraline (ZOLOFT)  100 MG tablet Take 1 tablet (100 mg total) by mouth daily. 30 tablet 2   No current facility-administered medications for this visit.    Allergies as of 03/27/2016  . (No Known Allergies)    Family History  Problem Relation Age of Onset  . Colon cancer Paternal Aunt     greater >60  . Lung cancer Father   . Heart disease Father   . Liver disease Neg Hx   . Celiac disease Neg Hx   . Diverticulitis Sister   . Anxiety disorder Sister   . Depression Sister     Social History   Social History  . Marital Status: Divorced    Spouse Name: N/A  . Number of  Children: 2  . Years of Education: N/A   Social History Main Topics  . Smoking status: Current Every Day Smoker -- 0.50 packs/day for 38 years    Types: Cigarettes  . Smokeless tobacco: None     Comment: "trying to quit"   . Alcohol Use: No  . Drug Use: No  . Sexual Activity: Not Currently   Other Topics Concern  . None   Social History Narrative    Review of Systems: As mentioned in HPI   Physical Exam: BP 117/63 mmHg  Pulse 71  Temp(Src) 97.4 F (36.3 C) (Oral)  Ht 5\' 3"  (1.6 m)  Wt 139 lb 9.6 oz (63.322 kg)  BMI 24.74 kg/m2 General:   Alert and oriented. No distress noted. Pleasant and cooperative.  Head:  Normocephalic and atraumatic. Eyes:  Conjuctiva clear without scleral icterus. Heart:  S1, S2 present without murmurs, rubs, or gallops. Regular rate and rhythm. Abdomen:  +BS, soft, non-tender and non-distended. No rebound or guarding. No HSM or masses noted. Msk:  Symmetrical without gross deformities. Normal posture. Extremities:  Without edema. Neurologic:  Alert and  oriented x4;  grossly normal neurologically Psych:  Alert and cooperative. Normal mood and affect.

## 2016-03-27 NOTE — Assessment & Plan Note (Signed)
Secondary to NSAIDs. Limiting this significantly now. Discussed absolute avoidance in future. No need for continued surveillance EGDs per last documentation. Continue PPI BID.

## 2016-03-27 NOTE — Assessment & Plan Note (Signed)
Linzess too strong. Start supplemental fiber only. Sounds mild in nature. May benefit from the 72 mcg Linzess dosing vs Trulance if fiber is not helpful. 6 month return.

## 2016-03-27 NOTE — Patient Instructions (Signed)
Continue Protonix twice a day.   I would like for you to take Metamucil or Benefiber daily as directed on the packaging.  I will see you in 6 months. If adding fiber does not improve your bowel habits, we can try a lower dose of Linzess or a completely different constipation medication.

## 2016-03-27 NOTE — Telephone Encounter (Signed)
patient has [employer], she is scheduled to see a new doctor 06/19/16.   She ask if you can prescribe her Zoloft and Xanax until June 20, 2016.  She said she hate to leave you, you have helped her a lot and she appreciate it.

## 2016-03-28 ENCOUNTER — Other Ambulatory Visit (HOSPITAL_COMMUNITY): Payer: Self-pay | Admitting: Psychiatry

## 2016-03-28 ENCOUNTER — Encounter (HOSPITAL_COMMUNITY): Payer: Self-pay | Admitting: *Deleted

## 2016-03-28 MED ORDER — SERTRALINE HCL 100 MG PO TABS: 100.0000 mg | ORAL_TABLET | Freq: Every day | ORAL | Status: DC

## 2016-03-28 MED ORDER — ALPRAZOLAM 0.5 MG PO TABS: 0.5000 mg | ORAL_TABLET | Freq: Three times a day (TID) | ORAL | Status: DC

## 2016-03-28 NOTE — Telephone Encounter (Signed)
zoloft sent in, xanax printed. Let her know that i personally am still a [facility] provider

## 2016-03-28 NOTE — Progress Notes (Signed)
Pt came into office to pick up printed script for her Xanax 0.5 mg TID. Pt D/L number is [id number] with expiration date of 06-24-2020. Pt agreed with printed script.

## 2016-03-28 NOTE — Telephone Encounter (Signed)
zoloft sent in, xanax printed. Let he know that I personally am still a [facility] provider

## 2016-03-28 NOTE — Telephone Encounter (Signed)
lmtcb and number provided 

## 2016-03-28 NOTE — Telephone Encounter (Signed)
Spoke with pt and she showed understanding 

## 2016-04-04 ENCOUNTER — Telehealth: Payer: Self-pay | Admitting: Gastroenterology

## 2016-04-04 NOTE — Telephone Encounter (Signed)
Great!

## 2016-04-04 NOTE — Telephone Encounter (Signed)
ANNA TOLD PATIENT THAT SHE COULD COME PICK UP SAMPLE OF LOW DOSE LINZESS FOR CONSTIPATION,   STATED THAT THE METAMUCIL DID NOT WORK   WILL BE HERE SOMETIME THIS MORNING

## 2016-04-04 NOTE — Telephone Encounter (Signed)
Vicente Males, I have left samples of Linzess 72 mcg #12 at front for pt to use one daily 30 min before breakfast.

## 2016-04-04 NOTE — Progress Notes (Signed)
CC'ED TO PCP 

## 2016-05-24 ENCOUNTER — Encounter (HOSPITAL_COMMUNITY): Payer: Self-pay | Admitting: Psychiatry

## 2016-05-24 ENCOUNTER — Ambulatory Visit (INDEPENDENT_AMBULATORY_CARE_PROVIDER_SITE_OTHER): Payer: Medicare FFS | Admitting: Psychiatry

## 2016-05-24 VITALS — BP 149/70 | HR 75 | Ht 63.0 in | Wt 139.8 lb

## 2016-05-24 DIAGNOSIS — F411 Generalized anxiety disorder: Secondary | ICD-10-CM | POA: Diagnosis not present

## 2016-05-24 MED ORDER — SERTRALINE HCL 100 MG PO TABS: 100.0000 mg | ORAL_TABLET | Freq: Two times a day (BID) | ORAL | Status: DC

## 2016-05-24 MED ORDER — ALPRAZOLAM 0.5 MG PO TABS: 0.5000 mg | ORAL_TABLET | Freq: Three times a day (TID) | ORAL | Status: AC

## 2016-05-24 NOTE — Progress Notes (Signed)
Patient ID: Cynthia Munoz, female   DOB: 1951-02-25, 65 y.o.   MRN: [id number] Patient ID: Cynthia Munoz, female   DOB: 1951-04-04, 65 y.o.   MRN: [id number] Patient ID: Cynthia Munoz, female   DOB: 1951/12/12, 65 y.o.   MRN: [id number] Patient ID: Cynthia Munoz, female   DOB: 1951-07-31, 65 y.o.   MRN: [id number]  Psychiatric  Adult follow up  Patient Identification: Cynthia Munoz MRN:  [id number] Date of Evaluation:  05/24/2016 Referral Source: [place] Chief Complaint:   Chief Complaint    Depression; Anxiety; Follow-up     Visit Diagnosis:    ICD-9-CM ICD-10-CM   1. Generalized anxiety disorder 300.02 F41.1    Diagnosis:   Patient Active Problem List   Diagnosis Date Noted  . Constipation [K59.00] 03/27/2016  . PUD (peptic ulcer disease) [K27.9]   . Black stool [K92.1] 06/02/2015  . IDA (iron deficiency anemia) [D50.9] 07/29/2013  . Abdominal pain, epigastric [R10.13] 06/04/2013  . RUQ pain [R10.11] 06/04/2013  . Abnormal LFTs [R79.89] 06/04/2013  . Microcytic anemia [D50.9] 06/04/2013   History of Present Illness:  This patient is a 65 year old divorced white female who lives alone in [place]. She has 2 grown sons and 3 grandchildren. 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  The patient was referred by [facility] for further evaluation and treatment of depression and anxiety.  The patient states that she began getting depressed around 2000. 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 Working around the public made her extremely anxious and she began having significant panic attacks. She was started on Paxil which worked for a while but eventually stopped working and she is now on Zoloft 200 mg daily. At the time she was also very depressed and cried all the time and the Zoloft has helped this tremendously.  About  5 years ago she caught her husband having an affair with one of her friends after 69 years of marriage. They are now divorced and this was a difficult time for her as well. She states that she used to be on Xanax and it helped her anxiety and panic but she is not on this anymore. She does states that her nerves got worse after her nephew died while working in the [employer]. Apparently he had fallen into a vat. She is very close to all her nieces and nephews and this was a hard blow for her.  Currently the patient states that she feels nervous and anxious all the time. She does go out some but avoids crowds. She does spend time with family members and attends church. She doesn't like dealing with a lot of people. She often feels nervous and anxious at home. She uses coloring books to help calm her down. Her sleep is variable, she's eating well. She denies psychotic symptoms like auditory or visual hallucinations or paranoia. She has never been suicidal and has had no previous psychiatric treatment.  The patient returns after 3 months. She is doing fairly well and getting out with friends and doing things. She has one sister who has dementia and calls her constantly repeats herself and this gets on her nerves. She would like to go up on the Xanax but I discouraged her from doing so because she is getting close to the age where it  may lead to dementia. She states in the past she was on a higher dose of Zoloft I think this would be a safer alternative to help her anxiety. She is going to [employer]join the [employer] and start working out in the pool Elements:  Location:  Global. Quality:  Worsening. Severity:  Moderate. Timing:  Daily. Duration:  Several years. Context:  Recent death of nephew. Associated Signs/Symptoms: Depression Symptoms:  psychomotor agitation, difficulty concentrating, anxiety, panic attacks, loss of energy/fatigue,  Anxiety Symptoms:  Excessive Worry, Social Anxiety,   Past Medical  History:  Past Medical History  Diagnosis Date  . Hypertension   . High cholesterol   . Depression   . Anxiety   . Skin cancer, basal cell   . GERD (gastroesophageal reflux disease)   . Shortness of breath   . COPD (chronic obstructive pulmonary disease) ([place])   . Hypertension     Past Surgical History  Procedure Laterality Date  . Cholecystectomy    . Back surgery    . Esophagogastroduodenoscopy  August 2013    Dr. Ria Bush Hou--> protocol.c reflux esophagitis. Nonbleeding gastric ulcers. Hiatal hernia. Biopsies negative for H. pylori or Barrett's esophagus. Followup EGD recommended but not done.  . Colonoscopy  August 2010    Dr. Barnabas Lister [place]--> sessile polyp removed from the rectum into from the sigmoid colon. Pathology: Hyperplastic   . Small bowel capsule endoscopy  October 2010    Dr. Barnabas Lister [place]--> normal.   . Colonoscopy with esophagogastroduodenoscopy (egd) N/A 08/11/2013    Dr. Oneida Alar: 1. Normal mucosa in the terminal ileum. 2. Mild diveticulosois was noted inthe sigmoid colon.  EGD: 1. abd pain due to PUD 2. No obvious source for diarrhea identified. Negative sprue on duodenal biopsy  . Tubal ligation    . Esophagogastroduodenoscopy N/A 01/06/2014    Dr. Oneida Alar: 1. Small ulcer was found at the pylorus 2. Medium sized ulcer was found in the prepyloric region of the stomach  3. Mild duodenitis. in the second portion of the duodenum. Needs surveillance EGD but did not follow-up until 2016. Neg H.pylori  . Esophagogastroduodenoscopy N/A 06/03/2015    Dr. Fields:persistent gastric ulcers due to NSAIDs. No need for surveillance as she has continued to have EGDs noting NSAID-induced ulcers and continues to use NSAIDs.    Family History:  Family History  Problem Relation Age of Onset  . Colon cancer Paternal Aunt     greater >60  . Lung cancer Father   . Heart disease Father   . Liver disease Neg Hx   . Celiac disease Neg Hx   . Diverticulitis Sister   . Anxiety disorder  Sister   . Depression Sister    Social History:   Social History   Social History  . Marital Status: Divorced    Spouse Name: N/A  . Number of Children: 2  . Years of Education: N/A   Social History Main Topics  . Smoking status: Current Every Day Smoker -- 0.50 packs/day for 38 years    Types: Cigarettes  . Smokeless tobacco: None     Comment: "trying to quit"   . Alcohol Use: No  . Drug Use: No  . Sexual Activity: Not Currently   Other Topics Concern  . None   Social History Narrative   Additional Social History: The patient grew up in [place] with both parents and 4 sisters. Her oldest sister is now deceased. She denies any history of trauma or abuse and states that she had  a fairly good childhood. She left school in the ninth grade to get married. She worked in [employer] for many years until it closed. She was married for 72 years but is now divorced. She is currently on disability  Musculoskeletal: Strength & Muscle Tone: within normal limits Gait & Station: normal Patient leans: N/A  Psychiatric Specialty Exam: Depression        Past medical history includes anxiety.   Anxiety Symptoms include nervous/anxious behavior.      Review of Systems  Gastrointestinal: Positive for heartburn.  Psychiatric/Behavioral: Positive for depression. The patient is nervous/anxious.   All other systems reviewed and are negative.   Blood pressure 149/70, pulse 75, height 5\' 3"  (1.6 m), weight 139 lb 12.8 oz (63.413 kg), SpO2 92 %.Body mass index is 24.77 kg/(m^2).  General Appearance: Casual, Neat and Well Groomed  Eye Contact:  Good  Speech:  Clear and Coherent  Volume:  Normal  Mood: Anxious But improved   Affect:  Congruent   Thought Process:  Goal Directed  Orientation:  Full (Time, Place, and Person)  Thought Content:  Rumination  Suicidal Thoughts:  No  Homicidal Thoughts:  No  Memory:  Immediate;   Good Recent;   Fair Remote;   Fair  Judgement:  Fair   Insight:  Fair  Psychomotor Activity:  Normal  Concentration:  Good  Recall:  [place] of Knowledge:Fair  Language: Good  Akathisia:  No  Handed:  Right  AIMS (if indicated):    Assets:  Communication Skills Desire for Improvement Physical Health Resilience Social Support Talents/Skills  ADL's:  Intact  Cognition: WNL  Sleep:  good   Is the patient at risk to self?  No. Has the patient been a risk to self in the past 6 months?  No. Has the patient been a risk to self within the distant past?  No. Is the patient a risk to others?  No. Has the patient been a risk to others in the past 6 months?  No. Has the patient been a risk to others within the distant past?  No.  Allergies:  No Known Allergies Current Medications: Current Outpatient Prescriptions  Medication Sig Dispense Refill  . albuterol (PROVENTIL HFA;VENTOLIN HFA) 108 (90 BASE) MCG/ACT inhaler Inhale 2 puffs into the lungs every 6 (six) hours as needed. Shortness of breath    . ALPRAZolam (XANAX) 0.5 MG tablet Take 1 tablet (0.5 mg total) by mouth 3 (three) times daily. 90 tablet 2  . amLODipine (NORVASC) 10 MG tablet Take 10 mg by mouth daily.    . Linaclotide (LINZESS) 145 MCG CAPS capsule Take 1 capsule (145 mcg total) by mouth daily. 30 capsule 0  . lisinopril (PRINIVIL,ZESTRIL) 20 MG tablet Take 40 mg by mouth daily.    Marland Kitchen NASONEX 50 MCG/ACT nasal spray Place 2 sprays into the nose daily.    . niacin 500 MG tablet Take 500 mg by mouth 2 (two) times daily.    . pantoprazole (PROTONIX) 40 MG tablet Take 1 tablet (40 mg total) by mouth 2 (two) times daily. 180 tablet 3  . pravastatin (PRAVACHOL) 40 MG tablet Take 80 mg by mouth at bedtime.    . sertraline (ZOLOFT) 100 MG tablet Take 1 tablet (100 mg total) by mouth 2 (two) times daily. 60 tablet 2   No current facility-administered medications for this visit.    Previous Psychotropic Medications: Yes   Substance Abuse History in the last 12 months:  No.  Consequences of Substance Abuse: NA  Medical Decision Making:  Review of Psycho-Social Stressors (1), Review or order clinical lab tests (1), Review and summation of old records (2), Established Problem, Worsening (2), Review of Medication Regimen & Side Effects (2) and Review of New Medication or Change in Dosage (2)  Treatment Plan Summary: Medication management   The patient will Increase Zoloft to 100 mg twice a day for depression and anxiety. She'll continue Xanax 0.5mg 3 times a day for anxiety. She'll return in 6 weeks    Jackey Housey, [facility] 5/31/201711:06 AM

## 2016-05-25 ENCOUNTER — Telehealth (HOSPITAL_COMMUNITY): Payer: Self-pay | Admitting: *Deleted

## 2016-05-25 NOTE — Telephone Encounter (Signed)
Pt called and stated that she called her insurance and they informed her that Dr. Harrington Challenger is not in network with her plan. Per pt, she asked them for her out of network benefit and they informed her that all she would have to pay for is her co pay and they would pay the rest of the bill. Asked pt what did she want to do with the appt she already have scheduled with Dr. Harrington Challenger and she stated she would like to keep that appt and stay with Dr. Harrington Challenger and appt was verified and pt showed understanding.

## 2016-06-29 ENCOUNTER — Ambulatory Visit (HOSPITAL_COMMUNITY): Payer: Self-pay | Admitting: Psychiatry

## 2016-07-31 ENCOUNTER — Ambulatory Visit (HOSPITAL_COMMUNITY): Payer: Self-pay | Admitting: Psychiatry

## 2016-08-24 ENCOUNTER — Encounter: Payer: Self-pay | Admitting: Gastroenterology

## 2016-09-05 ENCOUNTER — Ambulatory Visit (HOSPITAL_COMMUNITY): Payer: Self-pay | Admitting: Psychiatry

## 2016-09-26 ENCOUNTER — Other Ambulatory Visit (HOSPITAL_COMMUNITY): Payer: Self-pay | Admitting: Psychiatry

## 2016-09-27 ENCOUNTER — Other Ambulatory Visit (HOSPITAL_COMMUNITY): Payer: Self-pay | Admitting: Psychiatry

## 2016-09-30 ENCOUNTER — Other Ambulatory Visit (HOSPITAL_COMMUNITY): Payer: Self-pay | Admitting: Psychiatry

## 2016-11-03 ENCOUNTER — Other Ambulatory Visit (HOSPITAL_COMMUNITY): Payer: Self-pay | Admitting: Psychiatry

## 2017-04-09 ENCOUNTER — Other Ambulatory Visit: Payer: Self-pay | Admitting: Gastroenterology

## 2017-09-26 ENCOUNTER — Ambulatory Visit: Payer: Medicare HMO | Admitting: Gastroenterology

## 2017-11-29 ENCOUNTER — Telehealth: Payer: Self-pay | Admitting: Gastroenterology

## 2017-11-29 NOTE — Telephone Encounter (Signed)
[facility] CALLED AND WANTED TO KNOW WHEN AND IF THE PATIENT WAS DUE FOR ANOTHER TCS

## 2017-12-06 NOTE — Telephone Encounter (Signed)
Looks like last colonoscopy was 08/11/2013 by Dr. Oneida Alar. I do not see her on Recall. Dr. Oneida Alar, please advise!

## 2017-12-11 NOTE — Telephone Encounter (Signed)
REVIEWED TCS report from Aug 2014. Fax to PCP. NEXT TCS IN 2024 UNLESS PT HAVING RECTAL BLEEDING, CHANGE IN BOWEL HABITS, OR ANEMIA.

## 2017-12-13 NOTE — Telephone Encounter (Signed)
Faxed to PCP. Stacey, please nic or next colonoscopy.

## 2017-12-13 NOTE — Telephone Encounter (Signed)
ON RECALL  °

## 2018-04-01 LAB — GLUCOSE, POCT (MANUAL RESULT ENTRY): POC GLUCOSE: 127 mg/dL — AB (ref 70–99)

## 2018-07-03 DIAGNOSIS — F331 Major depressive disorder, recurrent, moderate: Secondary | ICD-10-CM | POA: Insufficient documentation

## 2018-08-13 ENCOUNTER — Telehealth: Payer: Self-pay | Admitting: Gastroenterology

## 2018-08-13 NOTE — Telephone Encounter (Signed)
LMOM to call.

## 2018-08-13 NOTE — Telephone Encounter (Signed)
I called pt. She said she has been having diarrhea for the last 2 weeks. No blood in stool, and stool is loose sometimes and watery sometimes. She is incontinent with it at times. Some abdominal pain, no nausea or vomiting. I told her that we have not seen her since 03/2016 and asked had she seen her PCP for this and she said NO. Vicente Males, please advise!

## 2018-08-13 NOTE — Telephone Encounter (Signed)
Pt wants to speak with AB or the nurse. She has had diarrhea for 2 weeks. [phone]

## 2018-08-13 NOTE — Telephone Encounter (Signed)
She was having constipation when I last saw her. We can arrange an appt for her, but please see PCP in the interim.

## 2018-08-14 ENCOUNTER — Encounter: Payer: Self-pay | Admitting: Gastroenterology

## 2018-08-14 NOTE — Telephone Encounter (Signed)
OV made and letter mailed °

## 2018-08-15 NOTE — Telephone Encounter (Signed)
LMOM to call.

## 2018-11-27 ENCOUNTER — Ambulatory Visit: Payer: Medicare HMO | Admitting: Gastroenterology

## 2019-02-19 ENCOUNTER — Telehealth: Payer: Self-pay | Admitting: Gastroenterology

## 2019-02-19 ENCOUNTER — Ambulatory Visit: Payer: Medicare HMO | Admitting: Gastroenterology

## 2019-02-19 ENCOUNTER — Encounter: Payer: Self-pay | Admitting: Gastroenterology

## 2019-02-19 NOTE — Telephone Encounter (Signed)
PATIENT WAS A NO SHOW AND LETTER SENT  °

## 2019-02-19 NOTE — Progress Notes (Deleted)
68 year old female with history of PUD due to NSAIDs, IDA, last EGD June 2016 with persistent gastric ulcers. Due to multiple EGDs with ulcers in setting of persistent NSAID use, no surveillance set due persistent NSAID use. Chronic constipation historically.

## 2019-10-30 ENCOUNTER — Encounter: Payer: Self-pay | Admitting: Orthopaedic Surgery

## 2019-12-25 ENCOUNTER — Encounter: Payer: Self-pay | Admitting: Orthopedic Surgery

## 2020-01-21 ENCOUNTER — Other Ambulatory Visit (HOSPITAL_COMMUNITY): Payer: Self-pay | Admitting: Neurology

## 2020-01-21 ENCOUNTER — Other Ambulatory Visit: Payer: Self-pay | Admitting: Neurology

## 2020-01-21 DIAGNOSIS — G8194 Hemiplegia, unspecified affecting left nondominant side: Secondary | ICD-10-CM

## 2020-01-21 DIAGNOSIS — I63511 Cerebral infarction due to unspecified occlusion or stenosis of right middle cerebral artery: Secondary | ICD-10-CM

## 2020-02-04 ENCOUNTER — Other Ambulatory Visit: Payer: Self-pay

## 2020-02-04 ENCOUNTER — Ambulatory Visit (HOSPITAL_COMMUNITY)
Admission: RE | Admit: 2020-02-04 | Discharge: 2020-02-04 | Disposition: A | Discharge status: Home / Self Care | Payer: Medicare HMO | Source: Ambulatory Visit | Attending: Neurology | Admitting: Neurology

## 2020-02-04 ENCOUNTER — Ambulatory Visit (HOSPITAL_COMMUNITY): Payer: Medicare HMO

## 2020-02-04 DIAGNOSIS — G8194 Hemiplegia, unspecified affecting left nondominant side: Secondary | ICD-10-CM | POA: Diagnosis present

## 2020-02-04 DIAGNOSIS — I63511 Cerebral infarction due to unspecified occlusion or stenosis of right middle cerebral artery: Secondary | ICD-10-CM | POA: Diagnosis present

## 2020-03-05 ENCOUNTER — Inpatient Hospital Stay (HOSPITAL_COMMUNITY)
Admission: EM | Admit: 2020-03-05 | Discharge: 2020-03-07 | DRG: 064 | Disposition: A | Discharge status: Home Health | Payer: Medicare HMO | Source: Ambulatory Visit | Attending: Family Medicine | Admitting: Family Medicine

## 2020-03-05 ENCOUNTER — Emergency Department (HOSPITAL_COMMUNITY): Payer: Medicare HMO

## 2020-03-05 ENCOUNTER — Ambulatory Visit (HOSPITAL_COMMUNITY)
Admission: RE | Admit: 2020-03-05 | Discharge: 2020-03-05 | Disposition: A | Discharge status: Home / Self Care | Payer: Medicare HMO | Source: Ambulatory Visit | Attending: Neurology | Admitting: Neurology

## 2020-03-05 ENCOUNTER — Other Ambulatory Visit: Payer: Self-pay

## 2020-03-05 ENCOUNTER — Encounter (HOSPITAL_COMMUNITY): Payer: Self-pay

## 2020-03-05 DIAGNOSIS — Z8249 Family history of ischemic heart disease and other diseases of the circulatory system: Secondary | ICD-10-CM | POA: Diagnosis not present

## 2020-03-05 DIAGNOSIS — I129 Hypertensive chronic kidney disease with stage 1 through stage 4 chronic kidney disease, or unspecified chronic kidney disease: Secondary | ICD-10-CM | POA: Diagnosis present

## 2020-03-05 DIAGNOSIS — Z800 Family history of malignant neoplasm of digestive organs: Secondary | ICD-10-CM

## 2020-03-05 DIAGNOSIS — I63511 Cerebral infarction due to unspecified occlusion or stenosis of right middle cerebral artery: Secondary | ICD-10-CM | POA: Insufficient documentation

## 2020-03-05 DIAGNOSIS — I63411 Cerebral infarction due to embolism of right middle cerebral artery: Principal | ICD-10-CM | POA: Diagnosis present

## 2020-03-05 DIAGNOSIS — R471 Dysarthria and anarthria: Secondary | ICD-10-CM | POA: Diagnosis present

## 2020-03-05 DIAGNOSIS — R29704 NIHSS score 4: Secondary | ICD-10-CM | POA: Diagnosis present

## 2020-03-05 DIAGNOSIS — E785 Hyperlipidemia, unspecified: Secondary | ICD-10-CM | POA: Diagnosis present

## 2020-03-05 DIAGNOSIS — E7800 Pure hypercholesterolemia, unspecified: Secondary | ICD-10-CM | POA: Diagnosis present

## 2020-03-05 DIAGNOSIS — K219 Gastro-esophageal reflux disease without esophagitis: Secondary | ICD-10-CM | POA: Diagnosis present

## 2020-03-05 DIAGNOSIS — J449 Chronic obstructive pulmonary disease, unspecified: Secondary | ICD-10-CM | POA: Diagnosis present

## 2020-03-05 DIAGNOSIS — I639 Cerebral infarction, unspecified: Secondary | ICD-10-CM | POA: Diagnosis not present

## 2020-03-05 DIAGNOSIS — I6521 Occlusion and stenosis of right carotid artery: Secondary | ICD-10-CM | POA: Diagnosis present

## 2020-03-05 DIAGNOSIS — F329 Major depressive disorder, single episode, unspecified: Secondary | ICD-10-CM | POA: Diagnosis present

## 2020-03-05 DIAGNOSIS — I10 Essential (primary) hypertension: Secondary | ICD-10-CM | POA: Diagnosis present

## 2020-03-05 DIAGNOSIS — Z791 Long term (current) use of non-steroidal anti-inflammatories (NSAID): Secondary | ICD-10-CM | POA: Diagnosis not present

## 2020-03-05 DIAGNOSIS — N1831 Chronic kidney disease, stage 3a: Secondary | ICD-10-CM | POA: Diagnosis present

## 2020-03-05 DIAGNOSIS — I7771 Dissection of carotid artery: Secondary | ICD-10-CM | POA: Diagnosis present

## 2020-03-05 DIAGNOSIS — Z801 Family history of malignant neoplasm of trachea, bronchus and lung: Secondary | ICD-10-CM | POA: Diagnosis not present

## 2020-03-05 DIAGNOSIS — F39 Unspecified mood [affective] disorder: Secondary | ICD-10-CM | POA: Diagnosis present

## 2020-03-05 DIAGNOSIS — F17210 Nicotine dependence, cigarettes, uncomplicated: Secondary | ICD-10-CM | POA: Diagnosis present

## 2020-03-05 DIAGNOSIS — G8194 Hemiplegia, unspecified affecting left nondominant side: Secondary | ICD-10-CM | POA: Diagnosis present

## 2020-03-05 DIAGNOSIS — Z7982 Long term (current) use of aspirin: Secondary | ICD-10-CM

## 2020-03-05 DIAGNOSIS — Z20822 Contact with and (suspected) exposure to covid-19: Secondary | ICD-10-CM | POA: Diagnosis present

## 2020-03-05 DIAGNOSIS — D509 Iron deficiency anemia, unspecified: Secondary | ICD-10-CM | POA: Diagnosis present

## 2020-03-05 DIAGNOSIS — Z79899 Other long term (current) drug therapy: Secondary | ICD-10-CM

## 2020-03-05 DIAGNOSIS — R4781 Slurred speech: Secondary | ICD-10-CM | POA: Diagnosis present

## 2020-03-05 DIAGNOSIS — K279 Peptic ulcer, site unspecified, unspecified as acute or chronic, without hemorrhage or perforation: Secondary | ICD-10-CM | POA: Diagnosis present

## 2020-03-05 DIAGNOSIS — Z85828 Personal history of other malignant neoplasm of skin: Secondary | ICD-10-CM

## 2020-03-05 DIAGNOSIS — I6389 Other cerebral infarction: Secondary | ICD-10-CM | POA: Diagnosis not present

## 2020-03-05 LAB — BASIC METABOLIC PANEL
Anion gap: 7 (ref 5–15)
BUN: 21 mg/dL (ref 8–23)
CO2: 24 mmol/L (ref 22–32)
Calcium: 11 mg/dL — ABNORMAL HIGH (ref 8.9–10.3)
Chloride: 110 mmol/L (ref 98–111)
Creatinine, Ser: 1.12 mg/dL — ABNORMAL HIGH (ref 0.44–1.00)
GFR calc Af Amer: 59 mL/min — ABNORMAL LOW (ref >60)
GFR calc non Af Amer: 51 mL/min — ABNORMAL LOW (ref >60)
Glucose, Bld: 105 mg/dL — ABNORMAL HIGH (ref 70–99)
Potassium: 4.2 mmol/L (ref 3.5–5.1)
Sodium: 141 mmol/L (ref 135–145)

## 2020-03-05 LAB — PROTIME-INR
INR: 1 (ref 0.8–1.2)
Prothrombin Time: 12.7 seconds (ref 11.4–15.2)

## 2020-03-05 MED ORDER — IOHEXOL 350 MG/ML SOLN
100.0000 mL | Freq: Once | INTRAVENOUS | Status: AC | PRN
  Administered 2020-03-05: 19:00:00 100 mL via INTRAVENOUS

## 2020-03-05 NOTE — ED Provider Notes (Signed)
[facility] EMERGENCY DEPARTMENT Provider Note   CSN: [id number] Arrival date & time: 03/05/20  1634     History Chief Complaint  Patient presents with  . Cerebrovascular Accident    Cynthia Munoz is a 69 y.o. female.  Presented to ER with concern for abnormal MRI.  Patient reports that in December she started having left-sided weakness.  Saw her primary care doctor outpatient who ordered an MRI.  Over the past week has noted some increased speech difficulty.  Denies any new numbness or new weakness.  Has been using cane to get around.  No vision changes.  Told she had mini stroke in December but denies history of other known stroke.  HPI     Past Medical History:  Diagnosis Date  . Anxiety   . COPD (chronic obstructive pulmonary disease) ([place])   . Depression   . GERD (gastroesophageal reflux disease)   . High cholesterol   . Hypertension   . Hypertension   . Shortness of breath   . Skin cancer, basal cell     Patient Active Problem List   Diagnosis Date Noted  . Ischemic stroke ([place]) 03/05/2020  . Essential hypertension 03/05/2020  . Stenosis of right internal carotid artery 03/05/2020  . Constipation 03/27/2016  . PUD (peptic ulcer disease)   . Black stool 06/02/2015  . IDA (iron deficiency anemia) 07/29/2013  . Abdominal pain, epigastric 06/04/2013  . RUQ pain 06/04/2013  . Abnormal LFTs 06/04/2013  . Microcytic anemia 06/04/2013    Past Surgical History:  Procedure Laterality Date  . BACK SURGERY    . CHOLECYSTECTOMY    . COLONOSCOPY  August 2010   Dr. Barnabas Lister Spainhour--> sessile polyp removed from the rectum into from the sigmoid colon. Pathology: Hyperplastic   . COLONOSCOPY WITH ESOPHAGOGASTRODUODENOSCOPY (EGD) N/A 08/11/2013   Dr. Oneida Alar: 1. Normal mucosa in the terminal ileum. 2. Mild diveticulosois was noted inthe sigmoid colon.  EGD: 1. abd pain due to PUD 2. No obvious source for diarrhea identified. Negative sprue on duodenal biopsy  .  ESOPHAGOGASTRODUODENOSCOPY  August 2013   Dr. Ria Bush Hou--> protocol.c reflux esophagitis. Nonbleeding gastric ulcers. Hiatal hernia. Biopsies negative for H. pylori or Barrett's esophagus. Followup EGD recommended but not done.  . ESOPHAGOGASTRODUODENOSCOPY N/A 01/06/2014   Dr. Oneida Alar: 1. Small ulcer was found at the pylorus 2. Medium sized ulcer was found in the prepyloric region of the stomach  3. Mild duodenitis. in the second portion of the duodenum. Needs surveillance EGD but did not follow-up until 2016. Neg H.pylori  . ESOPHAGOGASTRODUODENOSCOPY N/A 06/03/2015   Dr. Fields:persistent gastric ulcers due to NSAIDs. No need for surveillance as she has continued to have EGDs noting NSAID-induced ulcers and continues to use NSAIDs.   . Small bowel capsule endoscopy  October 2010   Dr. Barnabas Lister [place]hour--> normal.   . TUBAL LIGATION       OB History   No obstetric history on file.     Family History  Problem Relation Age of Onset  . Colon cancer Paternal Aunt        greater >60  . Lung cancer Father   . Heart disease Father   . Diverticulitis Sister   . Anxiety disorder Sister   . Depression Sister   . Liver disease Neg Hx   . Celiac disease Neg Hx     Social History   Tobacco Use  . Smoking status: Current Every Day Smoker    Packs/day: 0.50  Years: 38.00    Pack years: 19.00    Types: Cigarettes  . Tobacco comment: "trying to quit"   Substance Use Topics  . Alcohol use: No    Alcohol/week: 0.0 standard drinks  . Drug use: No    Home Medications Prior to Admission medications   Medication Sig Start Date End Date Taking? Authorizing Provider  albuterol (PROVENTIL HFA;VENTOLIN HFA) 108 (90 BASE) MCG/ACT inhaler Inhale 2 puffs into the lungs every 6 (six) hours as needed. Shortness of breath   Yes [provider]  amLODipine (NORVASC) 10 MG tablet Take 10 mg by mouth daily.   Yes [provider]  ARIPiprazole (ABILIFY) 15 MG tablet Take 15 mg by mouth  daily. 02/03/20  Yes [provider]  aspirin EC 81 MG tablet Take 243 mg by mouth daily.   Yes [provider]  Azelastine HCl 137 MCG/SPRAY SOLN Place 1-2 sprays into both nostrils daily as needed. 02/07/20  Yes [provider]  celecoxib (CELEBREX) 100 MG capsule Take 100 mg by mouth daily. 12/16/19  Yes [provider]  diclofenac (VOLTAREN) 75 MG EC tablet Take 75 mg by mouth 2 (two) times daily. 02/03/20  Yes [provider]  DULoxetine (CYMBALTA) 60 MG capsule Take 60 mg by mouth daily. 02/25/20  Yes [provider]  levocetirizine (XYZAL) 5 MG tablet Take 5 mg by mouth daily. 02/07/20  Yes [provider]  lisinopril (PRINIVIL,ZESTRIL) 20 MG tablet Take 20 mg by mouth daily.    Yes [provider]  pantoprazole (PROTONIX) 40 MG tablet TAKE ONE TABLET BY MOUTH TWICE DAILY Patient taking differently: Take 40 mg by mouth 2 (two) times daily.  04/11/17  Yes Carlis Stable, NP  rosuvastatin (CRESTOR) 10 MG tablet Take 10 mg by mouth at bedtime. 02/07/20  Yes [provider]  traZODone (DESYREL) 100 MG tablet Take 100 mg by mouth at bedtime. 02/05/20  Yes [provider]    Allergies    Patient has no known allergies.  Review of Systems   Review of Systems  Constitutional: Negative for chills and fever.  HENT: Negative for ear pain and sore throat.   Eyes: Negative for pain and visual disturbance.  Respiratory: Negative for cough and shortness of breath.   Cardiovascular: Negative for chest pain and palpitations.  Gastrointestinal: Negative for abdominal pain and vomiting.  Genitourinary: Negative for dysuria and hematuria.  Musculoskeletal: Negative for arthralgias and back pain.  Skin: Negative for color change and rash.  Neurological: Positive for speech difficulty, weakness and numbness. Negative for seizures and syncope.  All other systems reviewed and are negative.   Physical Exam Updated Vital  Signs BP (!) 152/68   Pulse (!) 57   Temp 97.9 F (36.6 C) (Oral)   Resp 20   Ht 5\' 3"  (1.6 m)   Wt 68.5 kg   SpO2 96%   BMI 26.75 kg/m   Physical Exam Vitals and nursing note reviewed.  Constitutional:      General: She is not in acute distress.    Appearance: She is well-developed.  HENT:     Head: Normocephalic and atraumatic.  Eyes:     Conjunctiva/sclera: Conjunctivae normal.  Cardiovascular:     Rate and Rhythm: Normal rate and regular rhythm.     Heart sounds: No murmur.  Pulmonary:     Effort: Pulmonary effort is normal. No respiratory distress.     Breath sounds: Normal breath sounds.  Abdominal:  Palpations: Abdomen is soft.     Tenderness: There is no abdominal tenderness.  Musculoskeletal:     Cervical back: Neck supple.  Skin:    General: Skin is warm and dry.  Neurological:     Mental Status: She is alert.     Comments: Alert, oriented x3, mild dysarthria, no aphasia, cranial nerves II through XII intact, visual fields intact, normal finger-nose-finger, 4-5 strength in left upper extremity, 4-5 strength in left lower extremity, 5 out of 5 strength in right upper and right lower extremities, sensation to light touch intact     ED Results / Procedures / Treatments   Labs (all labs ordered are listed, but only abnormal results are displayed) Labs Reviewed  BASIC METABOLIC PANEL - Abnormal; Notable for the following components:      Result Value   Glucose, Bld 105 (*)    Creatinine, Ser 1.12 (*)    Calcium 11.0 (*)    GFR calc non Af Amer 51 (*)    GFR calc Af Amer 59 (*)    All other components within normal limits  SARS CORONAVIRUS 2 (TAT 6-24 HRS)  PROTIME-INR  CBC WITH DIFFERENTIAL/PLATELET    EKG None  Radiology CT Angio Head W or Wo Contrast  Result Date: 03/05/2020 CLINICAL DATA:  Focal neuro deficit for greater than 6 hours. Right ICA occlusion. Left-sided numbness and weakness since December. Recent progression of slurred speech.  EXAM: CT ANGIOGRAPHY HEAD AND NECK TECHNIQUE: Multidetector CT imaging of the head and neck was performed using the standard protocol during bolus administration of intravenous contrast. Multiplanar CT image reconstructions and MIPs were obtained to evaluate the vascular anatomy. Carotid stenosis measurements (when applicable) are obtained utilizing NASCET criteria, using the distal internal carotid diameter as the denominator. CONTRAST:  152mL OMNIPAQUE IOHEXOL 350 MG/ML SOLN COMPARISON:  MRI of the head and MRA head 03/05/2020 FINDINGS: CT HEAD FINDINGS Brain: Right MCA watershed territory infarcts are again seen. No acute hemorrhage is present. There is no significant interval change. Ventricles are of normal size. No significant extraaxial fluid collection is present. The brainstem and cerebellum are within normal limits. Vascular: Atherosclerotic calcifications are present within the cavernous internal carotid arteries and at the dural margin of the vertebral arteries. No hyperdense vessel is present. Skull: Calvarium is intact. No focal lytic or blastic lesions are present. No significant extracranial soft tissue lesion is present. Sinuses: The paranasal sinuses and mastoid air cells are clear. Orbits: The globes and orbits are within normal limits. CTA NECK FINDINGS Aortic arch: A 3 vessel arch configuration is present. Atherosclerotic calcifications are present at the origin of the left subclavian artery without significant stenosis. Additional calcifications are present in the distal arch and proximal descending aorta without aneurysm. Right carotid system: Right common carotid artery is within normal limits. The right internal carotid artery is occluded approximately 2 cm from the bifurcation. Vessel is tapered with faint contrast just above this level until complete occlusion. This suggests dissection. There is no reconstitution in the neck. Left carotid system: The left common carotid artery is within  normal limits. Atherosclerotic changes are present at the left carotid bifurcation without significant stenosis. Cervical left ICA is normal. Vertebral arteries: The vertebral arteries originate from the subclavian arteries bilaterally without significant stenosis. Vertebral arteries are codominant. No significant stenosis is present in either vertebral artery in the neck. Skeleton: Endplate degenerative changes are most evident at C5-6 and C6-7. Prominent uncovertebral spurring contributes to foraminal stenosis at C3-4 left.  No focal lytic or blastic lesions are present. Other neck: The soft tissues the neck are otherwise unremarkable. Thyroid is normal. Salivary glands are within normal limits. No significant adenopathy is present. Upper chest: The lung apices are clear. The thoracic inlet is normal. Review of the MIP images confirms the above findings CTA HEAD FINDINGS Anterior circulation: Faint contrast is present within the cavernous right internal carotid artery. There is no contrast in the petrous segment. A tandem high-grade stenosis is present at the terminal right ICA. The right M1 segment is narrowed. MCA bifurcation is intact. The right MCA branch vessels fill. An azygos A1 segment is present from the right with high-grade stenosis and calcification. Both A2 segments fill. The right A1 segment is larger than the left. Distal branch vessels are visualized bilaterally. The left M1 segment is within limits. Left MCA bifurcation and branch vessels are normal. Posterior circulation: High-grade stenosis is present in the right V3 segment. Moderate narrowing is present in left V3 segment. Vertebrobasilar junction is. The basilar artery is small. A P1 segment is present on the right. Fetal type posterior cerebral arteries are otherwise present bilaterally. PCA branch vessels opacify bilaterally. Venous sinuses: The dural sinuses are patent. The straight sinus and deep cerebral veins are intact. Cortical veins  are within limits. Anatomic variants: Fetal type posterior cerebral arteries bilaterally. A small P1 segment is present on the right. Review of the MIP images confirms the above findings IMPRESSION: 1. 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 2. Flow is reconstituted in the right ICA within the cavernous internal carotid artery. 3. Tandem high-grade stenosis in the terminal right ICA/proximal right M1 segment. 4. Decreased size of right M1 segment and MCA branch vessels consistent with decreased perfusion pressure. 5. High-grade stenosis of azygos right A1 segment. The A2 segments are patent. 6. Left MCA is within normal limits. 7. Fetal type posterior cerebral arteries bilaterally. A small P1 segment feeds the right side. The right PCA, MCA, and bilateral ACA is are dependent on this small vessel. Electronically Signed   By: San Morelle M.D.   On: 03/05/2020 20:06   CT Angio Neck W and/or Wo Contrast  Result Date: 03/05/2020 CLINICAL DATA:  Focal neuro deficit for greater than 6 hours. Right ICA occlusion. Left-sided numbness and weakness since December. Recent progression of slurred speech. EXAM: CT ANGIOGRAPHY HEAD AND NECK TECHNIQUE: Multidetector CT imaging of the head and neck was performed using the standard protocol during bolus administration of intravenous contrast. Multiplanar CT image reconstructions and MIPs were obtained to evaluate the vascular anatomy. Carotid stenosis measurements (when applicable) are obtained utilizing NASCET criteria, using the distal internal carotid diameter as the denominator. CONTRAST:  164mL OMNIPAQUE IOHEXOL 350 MG/ML SOLN COMPARISON:  MRI of the head and MRA head 03/05/2020 FINDINGS: CT HEAD FINDINGS Brain: Right MCA watershed territory infarcts are again seen. No acute hemorrhage is present. There is no significant interval change. Ventricles are of normal size. No significant extraaxial fluid collection is present. The  brainstem and cerebellum are within normal limits. Vascular: Atherosclerotic calcifications are present within the cavernous internal carotid arteries and at the dural margin of the vertebral arteries. No hyperdense vessel is present. Skull: Calvarium is intact. No focal lytic or blastic lesions are present. No significant extracranial soft tissue lesion is present. Sinuses: The paranasal sinuses and mastoid air cells are clear. Orbits: The globes and orbits are within normal limits. CTA NECK FINDINGS Aortic arch: A 3 vessel arch configuration  is present. Atherosclerotic calcifications are present at the origin of the left subclavian artery without significant stenosis. Additional calcifications are present in the distal arch and proximal descending aorta without aneurysm. Right carotid system: Right common carotid artery is within normal limits. The right internal carotid artery is occluded approximately 2 cm from the bifurcation. Vessel is tapered with faint contrast just above this level until complete occlusion. This suggests dissection. There is no reconstitution in the neck. Left carotid system: The left common carotid artery is within normal limits. Atherosclerotic changes are present at the left carotid bifurcation without significant stenosis. Cervical left ICA is normal. Vertebral arteries: The vertebral arteries originate from the subclavian arteries bilaterally without significant stenosis. Vertebral arteries are codominant. No significant stenosis is present in either vertebral artery in the neck. Skeleton: Endplate degenerative changes are most evident at C5-6 and C6-7. Prominent uncovertebral spurring contributes to foraminal stenosis at C3-4 left. No focal lytic or blastic lesions are present. Other neck: The soft tissues the neck are otherwise unremarkable. Thyroid is normal. Salivary glands are within normal limits. No significant adenopathy is present. Upper chest: The lung apices are clear. The  thoracic inlet is normal. Review of the MIP images confirms the above findings CTA HEAD FINDINGS Anterior circulation: Faint contrast is present within the cavernous right internal carotid artery. There is no contrast in the petrous segment. A tandem high-grade stenosis is present at the terminal right ICA. The right M1 segment is narrowed. MCA bifurcation is intact. The right MCA branch vessels fill. An azygos A1 segment is present from the right with high-grade stenosis and calcification. Both A2 segments fill. The right A1 segment is larger than the left. Distal branch vessels are visualized bilaterally. The left M1 segment is within limits. Left MCA bifurcation and branch vessels are normal. Posterior circulation: High-grade stenosis is present in the right V3 segment. Moderate narrowing is present in left V3 segment. Vertebrobasilar junction is. The basilar artery is small. A P1 segment is present on the right. Fetal type posterior cerebral arteries are otherwise present bilaterally. PCA branch vessels opacify bilaterally. Venous sinuses: The dural sinuses are patent. The straight sinus and deep cerebral veins are intact. Cortical veins are within limits. Anatomic variants: Fetal type posterior cerebral arteries bilaterally. A small P1 segment is present on the right. Review of the MIP images confirms the above findings IMPRESSION: 1. 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 2. Flow is reconstituted in the right ICA within the cavernous internal carotid artery. 3. Tandem high-grade stenosis in the terminal right ICA/proximal right M1 segment. 4. Decreased size of right M1 segment and MCA branch vessels consistent with decreased perfusion pressure. 5. High-grade stenosis of azygos right A1 segment. The A2 segments are patent. 6. Left MCA is within normal limits. 7. Fetal type posterior cerebral arteries bilaterally. A small P1 segment feeds the right side. The right PCA, MCA,  and bilateral ACA is are dependent on this small vessel. Electronically Signed   By: San Morelle M.D.   On: 03/05/2020 20:06   MR ANGIO HEAD WO CONTRAST  Result Date: 03/05/2020 CLINICAL DATA:  Left hemiparesis EXAM: MRA HEAD WITHOUT CONTRAST TECHNIQUE: Angiographic images of the Circle of Willis were obtained using MRA technique without intravenous contrast. COMPARISON:  None. FINDINGS: The right ICA is occluded. The right common carotid artery is patent. The right M1 segment is patent. Asymmetric flow is present into right MCA branch vessels, suggesting decreased perfusion. The A1 segments are  not visualized. Atherosclerotic changes are noted at the genu of the left ICA without significant stenosis. Left posterior communicating artery is patent. Fetal type left posterior cerebral artery is present. Left MCA branch vessels are normal. There is significant attenuation of proximal A2 segments which could be artifactual. High-grade stenosis is present in the right V3 segment there is some narrowing just beyond the left PICA origin. Basilar artery is normal. Small P1 segment is present on the right, feeding the otherwise fetal type right posterior cerebral artery and the right MCA. IMPRESSION: 1. Occluded right internal carotid artery. 2. Predominantly fetal type right PCA. Both the right MCA and PCA are now fed by a small P1 segment only. 3. Decreased flow signal in the right MCA branch vessels consistent with attenuated flow. 4. Atherosclerotic changes at the cavernous left ICA without significant stenosis. 5. Fetal type left posterior cerebral artery. Electronically Signed   By: San Morelle M.D.   On: 03/05/2020 17:59   MR BRAIN WO CONTRAST  Result Date: 03/05/2020 CLINICAL DATA:  Left hemiparesis. EXAM: MRI HEAD WITHOUT CONTRAST TECHNIQUE: Multiplanar, multiecho pulse sequences of the brain and surrounding structures were obtained without intravenous contrast. COMPARISON:  None. FINDINGS:  Brain: Diffusion signal is evident within the right ACA MCA watershed territory. Intermediate signal is present on the ADC map without true restricted diffusion. Findings are consistent with a subacute nonhemorrhagic watershed territory infarct. No foci of restricted diffusion are present. No significant mass effect is present. There is also no significant loss of volume. Scattered subcortical T2 hyperintensities are present in the left hemisphere. The ventricles are of normal size. Mild white matter changes extend into the brainstem. Vascular: Abnormal signal is present in the right internal carotid artery through the ICA terminus. Flow is present in the left ICA. Flow is present in the posterior circulation. Skull and upper cervical spine: Degenerative changes are present at C2-3. Craniocervical junction is normal. Midline structures are otherwise unremarkable. Sinuses/Orbits: Bilateral mastoid effusions are present, right greater than left. The paranasal sinuses and mastoid air cells are otherwise clear. The globes and orbits are within normal limits. IMPRESSION: 1. Subacute nonhemorrhagic watershed territory infarct involving the right ACA watershed territory. This infarct is up to 22 to 53 weeks old. 2. Abnormal signal in the right internal carotid artery through the ICA terminus compatible with high-grade occlusion. 3. Scattered subcortical T2 hyperintensities bilaterally are mildly advanced for age. This likely reflects the sequela of chronic microvascular ischemia. 4. Bilateral mastoid effusions, right greater than left. No obstructing nasopharyngeal lesion is present. These results were called by telephone at the time of interpretation on 03/05/2020 at 5:11 pm to provider Dr. Roslynn Amble, who verbally acknowledged these results. Electronically Signed   By: San Morelle M.D.   On: 03/05/2020 17:19    Procedures .Critical Care Performed by: Lucrezia Starch, MD Authorized by: Lucrezia Starch, MD    Critical care provider statement:    Critical care time (minutes):  45   Critical care was necessary to treat or prevent imminent or life-threatening deterioration of the following conditions:  CNS failure or compromise   Critical care was time spent personally by me on the following activities:  Discussions with consultants, evaluation of patient's response to treatment, examination of patient, ordering and performing treatments and interventions, ordering and review of laboratory studies, ordering and review of radiographic studies, pulse oximetry, re-evaluation of patient's condition, obtaining history from patient or surrogate and review of old charts   (including critical care  time)  Medications Ordered in ED Medications  iohexol (OMNIPAQUE) 350 MG/ML injection 100 mL (100 mLs Intravenous Contrast Given 03/05/20 1913)    ED Course  I have reviewed the triage vital signs and the nursing notes.  Pertinent labs & imaging results that were available during my care of the patient were reviewed by me and considered in my medical decision making (see chart for details).  Clinical Course as of Mar 05 2229  Fri Mar 05, 2020  1710 D/w radiologist - findings of likely subacute (within past couple weeks) stroke but not likely stroke from last 24-48 hours but within a week or two   [TT]  2035 Reviewed CTs   [RD]  2041 D/w Rory Percy at [facility] - he agrees with [facility], hosp admission, stroke team to see tomorrow am, no need for neuro IR or vascular emergently tonight, they will further advise once pt at [facility]   [RD]    Clinical Course User Index [RD] Lucrezia Starch, MD [TT] Kem Parkinson, PA-C   MDM Rules/Calculators/A&P                      69 year old lady presenting to ER with concern for abnormal MRI.  Patient had been diagnosed with mini stroke in December, continue to have left-sided weakness, outpatient MRI ordered by PCP which showed acute to subacute right-sided stroke and  occluded right ICA.  I initially was going to call a code stroke when report of left-sided deficits with acute stroke on MRI however on further questioning, only new symptom was possible speech change over the past week.  Obtained stat teleneurology consult.  Reviewed case with the radiologist as well as the teleneurologist.  Per radiology recommendations, obtain CT angio head and neck to further characterize.  Right ICA occlusion with possible dissection.  Teleneurology recommended transfer to [facility] for neuro, IR versus vascular eval.  Reviewed with Rory Percy at [facility] - he states no role for acute intervention tonight from neuro IR or vascular - stroke team will see once patient at [facility]. Dr. Nehemiah Settle with hospitalist service admitted to [facility] service to be transferred to [facility].   Final Clinical Impression(s) / ED Diagnoses Final diagnoses:  Cerebrovascular accident (CVA), unspecified mechanism ([place])  ICAO (internal carotid artery occlusion), right    Rx / DC Orders ED Discharge Orders    None       Lucrezia Starch, MD 03/05/20 2233

## 2020-03-05 NOTE — H&P (Addendum)
History and Physical  Cynthia Munoz [id number] DOB: 02-20-1951 DOA: 03/05/2020  Referring physician: Dr Roslynn Amble, ED physician PCP: Vidal Schwalbe, MD  Outpatient Specialists:   Patient Coming From: home  Chief Complaint: abnormal MRI  HPI: Cynthia Munoz is a 69 y.o. female with a history of hypertension, COPD, high cholesterol, GERD, peptic ulcer disease.  Patient seen for abnormal MRI.  Patient has been having left arm and leg weakness and has been having to walk with a cane over the past 3 months.  Over the past week, she has been having some mild alert speech.  In December, the patient was seen at [facility] for arm and leg weakness and was told she had a mini stroke.  Because of continued symptoms, a outpatient MRI was scheduled, which she had today.  This showed an acute to subacute right MCA territory infarct with right carotid occlusion.  CT angiogram was also done in the ER showing a right ICA occlusion with possible secondary dissection with no reconstitution.  She has no palliating or provoking factors.  Her weakness has stayed roughly the same  Emergency Department Course: EDP discussed patient with neurologist, CTA was done with results as above.   Review of Systems:   Pt denies any fevers, chills, nausea, vomiting, diarrhea, constipation, abdominal pain, shortness of breath, dyspnea on exertion, orthopnea, cough, wheezing, palpitations, headache, vision changes, lightheadedness, dizziness, melena, rectal bleeding.  Review of systems are otherwise negative  Past Medical History:  Diagnosis Date  . Anxiety   . COPD (chronic obstructive pulmonary disease) ([place])   . Depression   . GERD (gastroesophageal reflux disease)   . High cholesterol   . Hypertension   . Hypertension   . Shortness of breath   . Skin cancer, basal cell    Past Surgical History:  Procedure Laterality Date  . BACK SURGERY    . CHOLECYSTECTOMY    . COLONOSCOPY  August 2010   Dr. Barnabas Lister  [facility]--> sessile polyp removed from the rectum into from the sigmoid colon. Pathology: Hyperplastic   . COLONOSCOPY WITH ESOPHAGOGASTRODUODENOSCOPY (EGD) N/A 08/11/2013   Dr. Oneida Alar: 1. Normal mucosa in the terminal ileum. 2. Mild diveticulosois was noted inthe sigmoid colon.  EGD: 1. abd pain due to PUD 2. No obvious source for diarrhea identified. Negative sprue on duodenal biopsy  . ESOPHAGOGASTRODUODENOSCOPY  August 2013   Dr. Ria Bush [facility]--> protocol.c reflux esophagitis. Nonbleeding gastric ulcers. Hiatal hernia. Biopsies negative for H. pylori or Barrett's esophagus. Followup EGD recommended but not done.  . ESOPHAGOGASTRODUODENOSCOPY N/A 01/06/2014   Dr. Oneida Alar: 1. Small ulcer was found at the pylorus 2. Medium sized ulcer was found in the prepyloric region of the stomach  3. Mild duodenitis. in the second portion of the duodenum. Needs surveillance EGD but did not follow-up until 2016. Neg H.pylori  . ESOPHAGOGASTRODUODENOSCOPY N/A 06/03/2015   Dr. Fields:persistent gastric ulcers due to NSAIDs. No need for surveillance as she has continued to have EGDs noting NSAID-induced ulcers and continues to use NSAIDs.   . Small bowel capsule endoscopy  October 2010   Dr. Barnabas Lister [facility]--> normal.   . TUBAL LIGATION     Social History:  reports that she has been smoking cigarettes. She has a 19.00 pack-year smoking history. She does not have any smokeless tobacco history on file. She reports that she does not drink alcohol or use drugs. Patient lives at home  No Known Allergies  Family History  Problem Relation Age of Onset  .  Colon cancer Paternal Aunt        greater >60  . Lung cancer Father   . Heart disease Father   . Diverticulitis Sister   . Anxiety disorder Sister   . Depression Sister   . Liver disease Neg Hx   . Celiac disease Neg Hx       Prior to Admission medications   Medication Sig Start Date End Date Taking? Authorizing Provider  albuterol (PROVENTIL HFA;VENTOLIN HFA) 108  (90 BASE) MCG/ACT inhaler Inhale 2 puffs into the lungs every 6 (six) hours as needed. Shortness of breath   Yes [provider]  amLODipine (NORVASC) 10 MG tablet Take 10 mg by mouth daily.   Yes [provider]  ARIPiprazole (ABILIFY) 15 MG tablet Take 15 mg by mouth daily. 02/03/20  Yes [provider]  aspirin EC 81 MG tablet Take 243 mg by mouth daily.   Yes [provider]  Azelastine HCl 137 MCG/SPRAY SOLN Place 1-2 sprays into both nostrils daily as needed. 02/07/20  Yes [provider]  celecoxib (CELEBREX) 100 MG capsule Take 100 mg by mouth daily. 12/16/19  Yes [provider]  diclofenac (VOLTAREN) 75 MG EC tablet Take 75 mg by mouth 2 (two) times daily. 02/03/20  Yes [provider]  DULoxetine (CYMBALTA) 60 MG capsule Take 60 mg by mouth daily. 02/25/20  Yes [provider]  levocetirizine (XYZAL) 5 MG tablet Take 5 mg by mouth daily. 02/07/20  Yes [provider]  lisinopril (PRINIVIL,ZESTRIL) 20 MG tablet Take 20 mg by mouth daily.    Yes [provider]  pantoprazole (PROTONIX) 40 MG tablet TAKE ONE TABLET BY MOUTH TWICE DAILY Patient taking differently: Take 40 mg by mouth 2 (two) times daily.  04/11/17  Yes Carlis Stable, NP  rosuvastatin (CRESTOR) 10 MG tablet Take 10 mg by mouth at bedtime. 02/07/20  Yes [provider]  traZODone (DESYREL) 100 MG tablet Take 100 mg by mouth at bedtime. 02/05/20  Yes [provider]    Physical Exam: BP 136/84   Pulse 68   Temp 97.9 F (36.6 C) (Oral)   Resp 19   Ht 5\' 3"  (1.6 m)   Wt 68.5 kg   SpO2 94%   BMI 26.75 kg/m   . General: Elderly female. Awake and alert and oriented x3. No acute cardiopulmonary distress.  Marland Kitchen HEENT: Normocephalic atraumatic.  Right and left ears normal in appearance.  Pupils equal, round, reactive to light. Extraocular muscles are intact. Sclerae anicteric and noninjected.  Moist mucosal membranes. No mucosal  lesions.  . Neck: Neck supple without lymphadenopathy. No carotid bruits. No masses palpated.  . Cardiovascular: Regular rate with normal S1-S2 sounds. No murmurs, rubs, gallops auscultated. No JVD.  Marland Kitchen Respiratory: Good respiratory effort with no wheezes, rales, rhonchi. Lungs clear to auscultation bilaterally.  No accessory muscle use. . Abdomen: Soft, nontender, nondistended. Active bowel sounds. No masses or hepatosplenomegaly  . Skin: No rashes, lesions, or ulcerations.  Dry, warm to touch. 2+ dorsalis pedis and radial pulses. . Musculoskeletal: No calf or leg pain. All major joints not erythematous nontender.  No upper or lower joint deformation.  Good ROM.  No contractures  . Psychiatric: Intact judgment and insight. Pleasant and cooperative. . Neurologic: No focal neurological deficits.  Strength is 4 out of 5 in the left upper extremity.  Strength is 5 out of 5 in the lower extremities bilaterally.  Coordination intact.  Cranial nerves II through XII  are grossly intact.           Labs on Admission: I have personally reviewed following labs and imaging studies  CBC: No results for input(s): WBC, NEUTROABS, HGB, HCT, MCV, PLT in the last 168 hours. Basic Metabolic Panel: Recent Labs  Lab 03/05/20 1813  NA 141  K 4.2  CL 110  CO2 24  GLUCOSE 105*  BUN 21  CREATININE 1.12*  CALCIUM 11.0*   GFR: Estimated Creatinine Clearance: 45.2 mL/min (A) (by C-G formula based on SCr of 1.12 mg/dL (H)). Liver Function Tests: No results for input(s): AST, ALT, ALKPHOS, BILITOT, PROT, ALBUMIN in the last 168 hours. No results for input(s): LIPASE, AMYLASE in the last 168 hours. No results for input(s): AMMONIA in the last 168 hours. Coagulation Profile: Recent Labs  Lab 03/05/20 1643  INR 1.0   Cardiac Enzymes: No results for input(s): CKTOTAL, CKMB, CKMBINDEX, TROPONINI in the last 168 hours. BNP (last 3 results) No results for input(s): PROBNP in the last 8760 hours. HbA1C: No  results for input(s): HGBA1C in the last 72 hours. CBG: No results for input(s): GLUCAP in the last 168 hours. Lipid Profile: No results for input(s): CHOL, HDL, LDLCALC, TRIG, CHOLHDL, LDLDIRECT in the last 72 hours. Thyroid Function Tests: No results for input(s): TSH, T4TOTAL, FREET4, T3FREE, THYROIDAB in the last 72 hours. Anemia Panel: No results for input(s): VITAMINB12, FOLATE, FERRITIN, TIBC, IRON, RETICCTPCT in the last 72 hours. Urine analysis: No results found for: COLORURINE, APPEARANCEUR, LABSPEC, PHURINE, GLUCOSEU, HGBUR, BILIRUBINUR, KETONESUR, PROTEINUR, UROBILINOGEN, NITRITE, LEUKOCYTESUR Sepsis Labs: @LABRCNTIP (procalcitonin:4,lacticidven:4) )No results found for this or any previous visit (from the past 240 hour(s)).   Radiological Exams on Admission: No results found.  EKG: Pending  Assessment/Plan: Principal Problem:   Ischemic stroke (HCC) Active Problems:   IDA (iron deficiency anemia)   PUD (peptic ulcer disease)   Essential hypertension   Stenosis of right internal carotid artery    This patient was discussed with the ED physician, including pertinent vitals, physical exam findings, labs, and imaging.  We also discussed care given by the ED provider.  1. Ischemic stroke a. Admit at [facility] b. Telemetry monitoring c. Echocardiogram in the morning d. Hemoglobin A1c and lipid panel in the morning e. Dual antiplatelet therapy per neurology recommendation f. Permissive hypertension g. PT/OT/speech therapy consult 2. Right ICA stenosis a. May need neuro IR consult for intervention 3. Hypertension a. Permissive hypertension 4. Peptic ulcer disease a. Continue Protonix 5. Iron deficiency anemia  DVT prophylaxis: SCDs for now until pharmacological DVT prophylaxis approved by neurology Consultants: Neuro Code Status: Full code Family Communication: None Disposition Plan: Pending assessment   Truett Mainland, DO

## 2020-03-05 NOTE — Consult Note (Signed)
[facility] [employer] TeleNeurology Consult Services  Stat Consult  Date of Service:   03/05/2020 16:50:32  Impression:     .  [id number] - Cerebrovascular accident (CVA) due to thrombosis of right carotid artery ([facility])  Comments/Sign-Out: Right ICA occlusion with possible subacute extension right hemispheric watershed infarcts. Slurred speech only new symptom. Recommend evaluation by Vascular/NIR to determine if carotid fully occluded vs high grade stenosis. Do not treat BP unless sys > 200 until further recs vascular/NIR obtained. Recommend ASA 81 mg + plavix 75 mg, high dose stain. Transfer to [facility] appropriate.  CT HEAD:  MRA: IMPRESSION: 1. Occluded right internal carotid artery. 2. Predominantly fetal type right PCA. Both the right MCA and PCA are now fed by a small P1 segment only. 3. Decreased flow signal in the right MCA branch vessels consistent with attenuated flow. 4. Atherosclerotic changes at the cavernous left ICA without significant stenosis. 5. Fetal type left posterior cerebral artery.  Metrics: [employer] Notification Time: 03/05/2020 16:48:12 Stamp Time: 03/05/2020 16:50:32 Callback Response Time: 03/05/2020 16:52:46  Our recommendations are outlined below.  Recommendations:     .  Recommend evaluation by Vascular/NIR to determine if carotid fully occluded vs high grade stenosis. Do not treat BP unless sys > 200 until further recs vascular/NIR obtained. TTE should be obtained.      .  Recommend ASA 81 mg + plavix 75 mg, high dose stain.     .  Transfer to [facility] appropriate   Therapies:     .  Physical Therapy, Occupational Therapy, Speech Therapy Assessment When Applicable  Disposition: Neurology Follow Up Recommended  Sign Out:     .  Discussed with Emergency Department Provider  ----------------------------------------------------------------------------------------------------  Chief Complaint: left sided  weakness  History of Present Illness: Patient is a 69 year old Female.  The patient developed left sided numbness andweakness last December. She noted some worsened slurred speech this week x 5 days. She has been using a cane to ambulate since December, that was unchanged this week. She denies new numbness or weakness. Outpatient MRI questioned acute stroke. MRI brain review reveals predominantly chronic right right hemispheric watershed distribution infarctions, however there may be a small subacute component. Right ICA occlusion is present.   Past Medical History:     . Hypertension     . There is NO history of Diabetes Mellitus     . There is NO history of Hyperlipidemia     . There is NO history of Atrial Fibrillation     . There is NO history of Coronary Artery Disease     . There is NO history of Stroke  Anticoagulant use:  No  Antiplatelet use: ASA 81 mg x 3 tabs.    Examination: BP(172/72), Pulse(81), Blood Glucose(127) 1A: Level of Consciousness - Alert; keenly responsive + 0 1B: Ask Month and Age - Both Questions Right + 0 1C: Blink Eyes & Squeeze Hands - Performs Both Tasks + 0 2: Test Horizontal Extraocular Movements - Normal + 0 3: Test Visual Fields - No Visual Loss + 0 4: Test Facial Palsy (Use Grimace if Obtunded) - Minor paralysis (flat nasolabial fold, smile asymmetry) + 1 5A: Test Left Arm Motor Drift - Drift, but doesn't hit bed + 1 5B: Test Right Arm Motor Drift - No Drift for 10 Seconds + 0 6A: Test Left Leg Motor Drift - Drift, but doesn't hit bed + 1 6B: Test Right Leg Motor Drift - No Drift for 5 Seconds +  0 7: Test Limb Ataxia (FNF/Heel-Shin) - No Ataxia + 0 8: Test Sensation - Normal; No sensory loss + 0 9: Test Language/Aphasia - Normal; No aphasia + 0 10: Test Dysarthria - Mild-Moderate Dysarthria: Slurring but can be understood + 1 11: Test Extinction/Inattention - No abnormality + 0  NIHSS Score: 4   Patient/Family was informed the Neurology  Consult would occur via TeleHealth consult by way of interactive audio and video telecommunications and consented to receiving care in this manner.    Dr Jeralene Huff   [employer] [phone]  Case [id number]

## 2020-03-05 NOTE — ED Triage Notes (Signed)
Pt was in outpatient MRI today for a routine MRI that has been scheduled for a while. This morning, pt began having left sided weakness and MRI tech saw an acute stroke on scan. Dr Roslynn Amble at bedside

## 2020-03-05 NOTE — ED Notes (Signed)
Spoke with family concerning pt.

## 2020-03-05 NOTE — ED Notes (Signed)
Code stroke Cancelled.  Teleneurologist paged for a STAT consult.

## 2020-03-05 NOTE — ED Notes (Signed)
Patient went to restroom with standby assistance. Patient has slight unsteady gait.

## 2020-03-05 NOTE — Plan of Care (Addendum)
[facility] on-call neuro hospitalist phone call note  Received a call from Dr. Roslynn Amble over at the emergency room at [facility] regarding Ms. Skogman who has been having left arm and leg weakness since December and was sent for outpatient MRI that showed acute to subacute right MCA territory infarct and on the MRA head also showed right carotid occlusion.  The CT angiogram was recommended as a follow-up which confirmed a right ICA occlusion, likely secondary to a dissection without reconstitution in the neck.  Flow is reconstituted in the ICA within the cavernous internal carotid artery.  There is tandem high-grade stenosis in the terminal right ICA and proximal right M1 segment.  Decreased size of right M1 segment and MCA branch vessels consistent with decreased perfusion pressure.  High-grade stenosis of the azygous right A1 segment.  The A2 segments are patent.  Left MCA within normal limits.  Few type posterior cerebral arteries bilaterally, a small P1 segment speeds the right side.  The right PCA MCA and bilateral ACA is dependent on the small vessel.  She has been evaluated by telemedicine neurology.  Dual antiplatelets have been recommended. I concur with the transfer to [facility], admit to hospitalist and stroke team will evaluate once the patient arrives over at [place] with permissive hypertension and dual antiplatelets for now along with close neuro monitoring.  I do not see the need for any emergent intervention since her symptoms have been ongoing now for days/weeks to months according to phone report.  Please call neurology when the patient arrives at [facility]  Stroke team will continue to follow with you.  -- Amie Portland, MD Neurohospitalist at [facility]

## 2020-03-05 NOTE — ED Notes (Signed)
Hospitalist at bedside 

## 2020-03-06 ENCOUNTER — Inpatient Hospital Stay (HOSPITAL_COMMUNITY): Payer: Medicare HMO

## 2020-03-06 DIAGNOSIS — I10 Essential (primary) hypertension: Secondary | ICD-10-CM

## 2020-03-06 DIAGNOSIS — I6389 Other cerebral infarction: Secondary | ICD-10-CM

## 2020-03-06 DIAGNOSIS — I6521 Occlusion and stenosis of right carotid artery: Secondary | ICD-10-CM

## 2020-03-06 DIAGNOSIS — I639 Cerebral infarction, unspecified: Secondary | ICD-10-CM

## 2020-03-06 DIAGNOSIS — J449 Chronic obstructive pulmonary disease, unspecified: Secondary | ICD-10-CM

## 2020-03-06 LAB — SARS CORONAVIRUS 2 (TAT 6-24 HRS): SARS Coronavirus 2: NEGATIVE

## 2020-03-06 LAB — LIPID PANEL
Cholesterol: 163 mg/dL (ref 0–200)
HDL: 54 mg/dL (ref >40)
LDL Cholesterol: 83 mg/dL (ref 0–99)
Total CHOL/HDL Ratio: 3 RATIO
Triglycerides: 132 mg/dL (ref <150)
VLDL: 26 mg/dL (ref 0–40)

## 2020-03-06 LAB — ECHOCARDIOGRAM COMPLETE
Height: 63 in
Weight: 2373.91 oz

## 2020-03-06 LAB — HIV ANTIBODY (ROUTINE TESTING W REFLEX): HIV Screen 4th Generation wRfx: NONREACTIVE

## 2020-03-06 LAB — HEMOGLOBIN A1C
Hgb A1c MFr Bld: 5.6 % (ref 4.8–5.6)
Mean Plasma Glucose: 114.02 mg/dL

## 2020-03-06 MED ORDER — ASPIRIN 300 MG RE SUPP
300.0000 mg | Freq: Every day | RECTAL | Status: DC
  Filled 2020-03-06: qty 1

## 2020-03-06 MED ORDER — ALBUTEROL SULFATE (2.5 MG/3ML) 0.083% IN NEBU: 3.0000 mL | INHALATION_SOLUTION | Freq: Four times a day (QID) | RESPIRATORY_TRACT | Status: DC | PRN

## 2020-03-06 MED ORDER — ACETAMINOPHEN 650 MG RE SUPP: 650.0000 mg | RECTAL | Status: DC | PRN

## 2020-03-06 MED ORDER — ROSUVASTATIN CALCIUM 5 MG PO TABS
10.0000 mg | ORAL_TABLET | Freq: Every day | ORAL | Status: DC
  Administered 2020-03-06 (×2): 10 mg via ORAL
  Filled 2020-03-06 (×2): qty 2

## 2020-03-06 MED ORDER — CLOPIDOGREL BISULFATE 75 MG PO TABS
75.0000 mg | ORAL_TABLET | Freq: Every day | ORAL | Status: DC
  Administered 2020-03-06 – 2020-03-07 (×2): 75 mg via ORAL
  Filled 2020-03-06 (×2): qty 1

## 2020-03-06 MED ORDER — ENOXAPARIN SODIUM 40 MG/0.4ML ~~LOC~~ SOLN
40.0000 mg | SUBCUTANEOUS | Status: DC
  Administered 2020-03-06: 40 mg via SUBCUTANEOUS
  Filled 2020-03-06: qty 0.4

## 2020-03-06 MED ORDER — ACETAMINOPHEN 325 MG PO TABS
650.0000 mg | ORAL_TABLET | ORAL | Status: DC | PRN
  Administered 2020-03-06 (×2): 650 mg via ORAL
  Filled 2020-03-06 (×2): qty 2

## 2020-03-06 MED ORDER — STROKE: EARLY STAGES OF RECOVERY BOOK
Freq: Once | Status: AC
  Filled 2020-03-06: qty 1

## 2020-03-06 MED ORDER — PANTOPRAZOLE SODIUM 40 MG PO TBEC
40.0000 mg | DELAYED_RELEASE_TABLET | Freq: Two times a day (BID) | ORAL | Status: DC
  Administered 2020-03-06 – 2020-03-07 (×3): 40 mg via ORAL
  Filled 2020-03-06 (×3): qty 1

## 2020-03-06 MED ORDER — DULOXETINE HCL 60 MG PO CPEP
60.0000 mg | ORAL_CAPSULE | Freq: Every day | ORAL | Status: DC
  Administered 2020-03-06 – 2020-03-07 (×2): 60 mg via ORAL
  Filled 2020-03-06 (×2): qty 1

## 2020-03-06 MED ORDER — ASPIRIN 325 MG PO TABS
325.0000 mg | ORAL_TABLET | Freq: Every day | ORAL | Status: DC
  Administered 2020-03-06 – 2020-03-07 (×2): 325 mg via ORAL
  Filled 2020-03-06 (×2): qty 1

## 2020-03-06 MED ORDER — ACETAMINOPHEN 160 MG/5ML PO SOLN: 650.0000 mg | ORAL | Status: DC | PRN

## 2020-03-06 MED ORDER — TRAZODONE HCL 100 MG PO TABS
100.0000 mg | ORAL_TABLET | Freq: Every day | ORAL | Status: DC
  Administered 2020-03-06: 100 mg via ORAL
  Filled 2020-03-06: qty 1

## 2020-03-06 MED ORDER — ARIPIPRAZOLE 10 MG PO TABS
15.0000 mg | ORAL_TABLET | Freq: Every day | ORAL | Status: DC
  Administered 2020-03-06 – 2020-03-07 (×2): 15 mg via ORAL
  Filled 2020-03-06 (×2): qty 2

## 2020-03-06 NOTE — Progress Notes (Signed)
PROGRESS NOTE    Cynthia Munoz  [id number] DOB: [birth year] DOA: 03/05/2020 PCP: Vidal Schwalbe, MD      Brief Narrative:  Cynthia Munoz is a 69 y.o. F with hx smoking, COPD, HTN who presented with several months left-sided weakness, needing to walk with a cane, and now MRI abnormality.  Patient diagnosed with TIA in December, but continued to have intermittent left-sided hemiparesis, speech difficulty.  The symptoms persisted so she went back to her PCP who ordered an outpatient MRI which showed an acute to subacute right MCA territory infarct with right carotid occlusion.  So she was sent to the ER.  In the ER she was started on aspirin and Plavix and transferred to come for neurology evaluation.       Assessment & Plan:  Acute right MCA infarct -Non-invasive angiography showed possible dissection right carotid, possible occlusion versus high-grade stenosis -Echocardiogram showed no cardiogenic source of embolism normal EF, grade 1 diastolic dysfunction -Carotid imaging by CT angiogram showed "right internal carotid artery... Occluded approximately 2 cm from the bifurcation.  Vessel is tapered within contrast above this level until complete occlusion" -Lipids ordered: Continue Crestor -Aspirin ordered at admission --> continue aspirin and Plavix -Atrial fibrillation: Not present on telemetry -tPA not given because outside the window -Dysphagia screen ordered in ER -PT eval ordered: recommended outpatient PT -Smoking cessation: Strongly recommended  Smoking  Mood disorder -Continue Abilify, duloxetine, trazodone  COPD No active disease  CKD stage IIIa Creatinine stable relative to baseline  Hypertension -Hold amlodipine, lisinopril allow permissive HTN   GERD and history of PUD -Continue PPI BID       Disposition: The patient was admitted with acute stroke.  I will discharge when vascular surgery evaluated the patient, and made any additional  recommendations on her suspected carotid dissection.  Will need aspirin and Plavix for 3 months, then Plavix alone, repeat neurology visit in 3 months and CT angiogram repeat in 3 months.        MDM: The below labs and imaging reports were reviewed and summarized above.  Medication management as above.     DVT prophylaxis: Lovenox Code Status: Full code Family Communication: Sister by phone    Consultants:   Neurology  Vascular surgery  Procedures:     Antimicrobials:      Culture data:              Subjective: No change in her symptoms.  Her left-sided weakness is minimal, not perceptible on my exam.  She is had no headache, anterior neck pain, chest discomfort, palpitations.  Objective: Vitals:   03/06/20 0556 03/06/20 0826 03/06/20 1218 03/06/20 1552  BP: (!) 144/58 129/77 (!) 153/64 (!) 135/59  Pulse: 66 60 64 (!) 57  Resp: 18 18 18 18   Temp: 99.1 F (37.3 C) 98.2 F (36.8 C) 98.4 F (36.9 C) 98.4 F (36.9 C)  TempSrc: Oral Oral Oral Oral  SpO2: 91% 98% 96% 96%  Weight:      Height:        Intake/Output Summary (Last 24 hours) at 03/06/2020 1621 Last data filed at 03/06/2020 1300 Gross per 24 hour  Intake 680 ml  Output --  Net 680 ml   Filed Weights   03/05/20 1650 03/06/20 0126  Weight: 68.5 kg 67.3 kg    Examination: General appearance:  adult female, alert and in no acute distress.   sitting in recliner HEENT: Anicteric, conjunctiva pink, lids and lashes normal. No nasal deformity, discharge,  epistaxis.  Lips moist, dentures in place, oropharynx moist, no oral lesions, hearing normal.   Skin: Warm and dry.  No jaundice.  No suspicious rashes or lesions. Cardiac: RRR, nl S1-S2, no murmurs appreciated.  Capillary refill is brisk.  JVP normal.  No LE edema.  Radial pulses 2+ and symmetric. Respiratory: Normal respiratory rate and rhythm.  CTAB without rales or wheezes. Abdomen: Abdomen soft.  No TTP or guarding. No ascites,  distension, hepatosplenomegaly.   MSK: No deformities or effusions. Neuro: Awake and alert.  EOMI, moves all extremities. Speech fluent.    Psych: Sensorium intact and responding to questions, attention normal. Affect normal.  Judgment and insight appear normal.    Data Reviewed: I have personally reviewed following labs and imaging studies:  CBC: No results for input(s): WBC, NEUTROABS, HGB, HCT, MCV, PLT in the last 168 hours. Basic Metabolic Panel: Recent Labs  Lab 03/05/20 1813  NA 141  K 4.2  CL 110  CO2 24  GLUCOSE 105*  BUN 21  CREATININE 1.12*  CALCIUM 11.0*   GFR: Estimated Creatinine Clearance: 44.9 mL/min (A) (by C-G formula based on SCr of 1.12 mg/dL (H)). Liver Function Tests: No results for input(s): AST, ALT, ALKPHOS, BILITOT, PROT, ALBUMIN in the last 168 hours. No results for input(s): LIPASE, AMYLASE in the last 168 hours. No results for input(s): AMMONIA in the last 168 hours. Coagulation Profile: Recent Labs  Lab 03/05/20 1643  INR 1.0   Cardiac Enzymes: No results for input(s): CKTOTAL, CKMB, CKMBINDEX, TROPONINI in the last 168 hours. BNP (last 3 results) No results for input(s): PROBNP in the last 8760 hours. HbA1C: Recent Labs    03/06/20 0448  HGBA1C 5.6   CBG: No results for input(s): GLUCAP in the last 168 hours. Lipid Profile: Recent Labs    03/06/20 0448  CHOL 163  HDL 54  LDLCALC 83  TRIG 132  CHOLHDL 3.0   Thyroid Function Tests: No results for input(s): TSH, T4TOTAL, FREET4, T3FREE, THYROIDAB in the last 72 hours. Anemia Panel: No results for input(s): VITAMINB12, FOLATE, FERRITIN, TIBC, IRON, RETICCTPCT in the last 72 hours. Urine analysis: No results found for: COLORURINE, APPEARANCEUR, LABSPEC, PHURINE, GLUCOSEU, HGBUR, BILIRUBINUR, KETONESUR, PROTEINUR, UROBILINOGEN, NITRITE, LEUKOCYTESUR Sepsis Labs: @LABRCNTIP (procalcitonin:4,lacticacidven:4)  ) Recent Results (from the past 240 hour(s))  SARS CORONAVIRUS 2  (TAT 6-24 HRS) Nasopharyngeal Nasopharyngeal Swab     Status: None   Collection Time: 03/05/20  8:27 PM   Specimen: Nasopharyngeal Swab  Result Value Ref Range Status   SARS Coronavirus 2 NEGATIVE NEGATIVE Final    Comment: (NOTE) SARS-CoV-2 target nucleic acids are NOT DETECTED. The SARS-CoV-2 RNA is generally detectable in upper and lower respiratory specimens during the acute phase of infection. Negative results do not preclude SARS-CoV-2 infection, do not rule out co-infections with other pathogens, and should not be used as the sole basis for treatment or other patient management decisions. Negative results must be combined with clinical observations, patient history, and epidemiological information. The expected result is Negative. Fact Sheet for Patients: SugarRoll.be Fact Sheet for Healthcare Providers: https://www.woods-mathews.com/ This test is not yet approved or cleared by the [place] FDA and  has been authorized for detection and/or diagnosis of SARS-CoV-2 by FDA under an Emergency Use Authorization (EUA). This EUA will remain  in effect (meaning this test can be used) for the duration of the COVID-19 declaration under Section 56 4(b)(1) of the Act, 21 U.S.C. section 360bbb-3(b)(1), unless the authorization is terminated or revoked sooner.  Performed at [facility], [place] [address] [place], [place] [address]          Radiology Studies: CT Angio Head W or Wo Contrast  Result Date: 03/05/2020 CLINICAL DATA:  Focal neuro deficit for greater than 6 hours. Right ICA occlusion. Left-sided numbness and weakness since December. Recent progression of slurred speech. EXAM: CT ANGIOGRAPHY HEAD AND NECK TECHNIQUE: Multidetector CT imaging of the head and neck was performed using the standard protocol during bolus administration of intravenous contrast. Multiplanar CT image reconstructions and MIPs were obtained to evaluate the  vascular anatomy. Carotid stenosis measurements (when applicable) are obtained utilizing NASCET criteria, using the distal internal carotid diameter as the denominator. CONTRAST:  172mL OMNIPAQUE IOHEXOL 350 MG/ML SOLN COMPARISON:  MRI of the head and MRA head 03/05/2020 FINDINGS: CT HEAD FINDINGS Brain: Right MCA watershed territory infarcts are again seen. No acute hemorrhage is present. There is no significant interval change. Ventricles are of normal size. No significant extraaxial fluid collection is present. The brainstem and cerebellum are within normal limits. Vascular: Atherosclerotic calcifications are present within the cavernous internal carotid arteries and at the dural margin of the vertebral arteries. No hyperdense vessel is present. Skull: Calvarium is intact. No focal lytic or blastic lesions are present. No significant extracranial soft tissue lesion is present. Sinuses: The paranasal sinuses and mastoid air cells are clear. Orbits: The globes and orbits are within normal limits. CTA NECK FINDINGS Aortic arch: A 3 vessel arch configuration is present. Atherosclerotic calcifications are present at the origin of the left subclavian artery without significant stenosis. Additional calcifications are present in the distal arch and proximal descending aorta without aneurysm. Right carotid system: Right common carotid artery is within normal limits. The right internal carotid artery is occluded approximately 2 cm from the bifurcation. Vessel is tapered with faint contrast just above this level until complete occlusion. This suggests dissection. There is no reconstitution in the neck. Left carotid system: The left common carotid artery is within normal limits. Atherosclerotic changes are present at the left carotid bifurcation without significant stenosis. Cervical left ICA is normal. Vertebral arteries: The vertebral arteries originate from the subclavian arteries bilaterally without significant stenosis.  Vertebral arteries are codominant. No significant stenosis is present in either vertebral artery in the neck. Skeleton: Endplate degenerative changes are most evident at C5-6 and C6-7. Prominent uncovertebral spurring contributes to foraminal stenosis at C3-4 left. No focal lytic or blastic lesions are present. Other neck: The soft tissues the neck are otherwise unremarkable. Thyroid is normal. Salivary glands are within normal limits. No significant adenopathy is present. Upper chest: The lung apices are clear. The thoracic inlet is normal. Review of the MIP images confirms the above findings CTA HEAD FINDINGS Anterior circulation: Faint contrast is present within the cavernous right internal carotid artery. There is no contrast in the petrous segment. A tandem high-grade stenosis is present at the terminal right ICA. The right M1 segment is narrowed. MCA bifurcation is intact. The right MCA branch vessels fill. An azygos A1 segment is present from the right with high-grade stenosis and calcification. Both A2 segments fill. The right A1 segment is larger than the left. Distal branch vessels are visualized bilaterally. The left M1 segment is within limits. Left MCA bifurcation and branch vessels are normal. Posterior circulation: High-grade stenosis is present in the right V3 segment. Moderate narrowing is present in left V3 segment. Vertebrobasilar junction is. The basilar artery is small. A P1 segment is present on  the right. Fetal type posterior cerebral arteries are otherwise present bilaterally. PCA branch vessels opacify bilaterally. Venous sinuses: The dural sinuses are patent. The straight sinus and deep cerebral veins are intact. Cortical veins are within limits. Anatomic variants: Fetal type posterior cerebral arteries bilaterally. A small P1 segment is present on the right. Review of the MIP images confirms the above findings IMPRESSION: 1. 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 2. Flow is reconstituted in the right ICA within the cavernous internal carotid artery. 3. Tandem high-grade stenosis in the terminal right ICA/proximal right M1 segment. 4. Decreased size of right M1 segment and MCA branch vessels consistent with decreased perfusion pressure. 5. High-grade stenosis of azygos right A1 segment. The A2 segments are patent. 6. Left MCA is within normal limits. 7. Fetal type posterior cerebral arteries bilaterally. A small P1 segment feeds the right side. The right PCA, MCA, and bilateral ACA is are dependent on this small vessel. Electronically Signed   By: San Morelle M.D.   On: 03/05/2020 20:06   CT Angio Neck W and/or Wo Contrast  Result Date: 03/05/2020 CLINICAL DATA:  Focal neuro deficit for greater than 6 hours. Right ICA occlusion. Left-sided numbness and weakness since December. Recent progression of slurred speech. EXAM: CT ANGIOGRAPHY HEAD AND NECK TECHNIQUE: Multidetector CT imaging of the head and neck was performed using the standard protocol during bolus administration of intravenous contrast. Multiplanar CT image reconstructions and MIPs were obtained to evaluate the vascular anatomy. Carotid stenosis measurements (when applicable) are obtained utilizing NASCET criteria, using the distal internal carotid diameter as the denominator. CONTRAST:  129mL OMNIPAQUE IOHEXOL 350 MG/ML SOLN COMPARISON:  MRI of the head and MRA head 03/05/2020 FINDINGS: CT HEAD FINDINGS Brain: Right MCA watershed territory infarcts are again seen. No acute hemorrhage is present. There is no significant interval change. Ventricles are of normal size. No significant extraaxial fluid collection is present. The brainstem and cerebellum are within normal limits. Vascular: Atherosclerotic calcifications are present within the cavernous internal carotid arteries and at the dural margin of the vertebral arteries. No hyperdense vessel is present. Skull: Calvarium is intact. No focal  lytic or blastic lesions are present. No significant extracranial soft tissue lesion is present. Sinuses: The paranasal sinuses and mastoid air cells are clear. Orbits: The globes and orbits are within normal limits. CTA NECK FINDINGS Aortic arch: A 3 vessel arch configuration is present. Atherosclerotic calcifications are present at the origin of the left subclavian artery without significant stenosis. Additional calcifications are present in the distal arch and proximal descending aorta without aneurysm. Right carotid system: Right common carotid artery is within normal limits. The right internal carotid artery is occluded approximately 2 cm from the bifurcation. Vessel is tapered with faint contrast just above this level until complete occlusion. This suggests dissection. There is no reconstitution in the neck. Left carotid system: The left common carotid artery is within normal limits. Atherosclerotic changes are present at the left carotid bifurcation without significant stenosis. Cervical left ICA is normal. Vertebral arteries: The vertebral arteries originate from the subclavian arteries bilaterally without significant stenosis. Vertebral arteries are codominant. No significant stenosis is present in either vertebral artery in the neck. Skeleton: Endplate degenerative changes are most evident at C5-6 and C6-7. Prominent uncovertebral spurring contributes to foraminal stenosis at C3-4 left. No focal lytic or blastic lesions are present. Other neck: The soft tissues the neck are otherwise unremarkable. Thyroid is normal. Salivary glands are within normal limits.  No significant adenopathy is present. Upper chest: The lung apices are clear. The thoracic inlet is normal. Review of the MIP images confirms the above findings CTA HEAD FINDINGS Anterior circulation: Faint contrast is present within the cavernous right internal carotid artery. There is no contrast in the petrous segment. A tandem high-grade stenosis is  present at the terminal right ICA. The right M1 segment is narrowed. MCA bifurcation is intact. The right MCA branch vessels fill. An azygos A1 segment is present from the right with high-grade stenosis and calcification. Both A2 segments fill. The right A1 segment is larger than the left. Distal branch vessels are visualized bilaterally. The left M1 segment is within limits. Left MCA bifurcation and branch vessels are normal. Posterior circulation: High-grade stenosis is present in the right V3 segment. Moderate narrowing is present in left V3 segment. Vertebrobasilar junction is. The basilar artery is small. A P1 segment is present on the right. Fetal type posterior cerebral arteries are otherwise present bilaterally. PCA branch vessels opacify bilaterally. Venous sinuses: The dural sinuses are patent. The straight sinus and deep cerebral veins are intact. Cortical veins are within limits. Anatomic variants: Fetal type posterior cerebral arteries bilaterally. A small P1 segment is present on the right. Review of the MIP images confirms the above findings IMPRESSION: 1. 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 2. Flow is reconstituted in the right ICA within the cavernous internal carotid artery. 3. Tandem high-grade stenosis in the terminal right ICA/proximal right M1 segment. 4. Decreased size of right M1 segment and MCA branch vessels consistent with decreased perfusion pressure. 5. High-grade stenosis of azygos right A1 segment. The A2 segments are patent. 6. Left MCA is within normal limits. 7. Fetal type posterior cerebral arteries bilaterally. A small P1 segment feeds the right side. The right PCA, MCA, and bilateral ACA is are dependent on this small vessel. Electronically Signed   By: San Morelle M.D.   On: 03/05/2020 20:06   MR ANGIO HEAD WO CONTRAST  Result Date: 03/05/2020 CLINICAL DATA:  Left hemiparesis EXAM: MRA HEAD WITHOUT CONTRAST TECHNIQUE:  Angiographic images of the Circle of Willis were obtained using MRA technique without intravenous contrast. COMPARISON:  None. FINDINGS: The right ICA is occluded. The right common carotid artery is patent. The right M1 segment is patent. Asymmetric flow is present into right MCA branch vessels, suggesting decreased perfusion. The A1 segments are not visualized. Atherosclerotic changes are noted at the genu of the left ICA without significant stenosis. Left posterior communicating artery is patent. Fetal type left posterior cerebral artery is present. Left MCA branch vessels are normal. There is significant attenuation of proximal A2 segments which could be artifactual. High-grade stenosis is present in the right V3 segment there is some narrowing just beyond the left PICA origin. Basilar artery is normal. Small P1 segment is present on the right, feeding the otherwise fetal type right posterior cerebral artery and the right MCA. IMPRESSION: 1. Occluded right internal carotid artery. 2. Predominantly fetal type right PCA. Both the right MCA and PCA are now fed by a small P1 segment only. 3. Decreased flow signal in the right MCA branch vessels consistent with attenuated flow. 4. Atherosclerotic changes at the cavernous left ICA without significant stenosis. 5. Fetal type left posterior cerebral artery. Electronically Signed   By: San Morelle M.D.   On: 03/05/2020 17:59   MR BRAIN WO CONTRAST  Result Date: 03/05/2020 CLINICAL DATA:  Left hemiparesis. EXAM: MRI HEAD WITHOUT  CONTRAST TECHNIQUE: Multiplanar, multiecho pulse sequences of the brain and surrounding structures were obtained without intravenous contrast. COMPARISON:  None. FINDINGS: Brain: Diffusion signal is evident within the right ACA MCA watershed territory. Intermediate signal is present on the ADC map without true restricted diffusion. Findings are consistent with a subacute nonhemorrhagic watershed territory infarct. No foci of restricted  diffusion are present. No significant mass effect is present. There is also no significant loss of volume. Scattered subcortical T2 hyperintensities are present in the left hemisphere. The ventricles are of normal size. Mild white matter changes extend into the brainstem. Vascular: Abnormal signal is present in the right internal carotid artery through the ICA terminus. Flow is present in the left ICA. Flow is present in the posterior circulation. Skull and upper cervical spine: Degenerative changes are present at C2-3. Craniocervical junction is normal. Midline structures are otherwise unremarkable. Sinuses/Orbits: Bilateral mastoid effusions are present, right greater than left. The paranasal sinuses and mastoid air cells are otherwise clear. The globes and orbits are within normal limits. IMPRESSION: 1. Subacute nonhemorrhagic watershed territory infarct involving the right ACA watershed territory. This infarct is up to 34 to 50 weeks old. 2. Abnormal signal in the right internal carotid artery through the ICA terminus compatible with high-grade occlusion. 3. Scattered subcortical T2 hyperintensities bilaterally are mildly advanced for age. This likely reflects the sequela of chronic microvascular ischemia. 4. Bilateral mastoid effusions, right greater than left. No obstructing nasopharyngeal lesion is present. These results were called by telephone at the time of interpretation on 03/05/2020 at 5:11 pm to provider Dr. Roslynn Amble, who verbally acknowledged these results. Electronically Signed   By: San Morelle M.D.   On: 03/05/2020 17:19   ECHOCARDIOGRAM COMPLETE  Result Date: 03/06/2020    ECHOCARDIOGRAM REPORT   Patient Name:   AILEAH KRIZAN Date of Exam: 03/06/2020 Medical Rec #:  [id number]        Height:       63.0 in Accession #:    [id number]       Weight:       148.4 lb Date of Birth:  17-Jan-1951         BSA:          1.703 m Patient Age:    86 years         BP:           129/77 mmHg Patient  Gender: F                HR:           64 bpm. Exam Location:  Inpatient Procedure: Color Doppler, Cardiac Doppler and 2D Echo Indications:    Stroke i163.9  History:        Patient has no prior history of Echocardiogram examinations.                 COPD; Risk Factors:Hypertension.  Sonographer:    Raquel Sarna Senior RDCS Referring Phys: [place]  1. Left ventricular ejection fraction, by estimation, is 60 to 65%. The left ventricle has normal function. The left ventricle has no regional wall motion abnormalities. Left ventricular diastolic parameters are consistent with Grade I diastolic dysfunction (impaired relaxation).  2. Right ventricular systolic function is low normal. The right ventricular size is normal. Tricuspid regurgitation signal is inadequate for assessing PA pressure.  3. The mitral valve is abnormal. Trivial mitral valve regurgitation.  4. The aortic valve is tricuspid. Aortic valve regurgitation is not  visualized.  5. The inferior vena cava is normal in size with greater than 50% respiratory variability, suggesting right atrial pressure of 3 mmHg. FINDINGS  Left Ventricle: Left ventricular ejection fraction, by estimation, is 60 to 65%. The left ventricle has normal function. The left ventricle has no regional wall motion abnormalities. The left ventricular internal cavity size was normal in size. There is  no left ventricular hypertrophy. Left ventricular diastolic parameters are consistent with Grade I diastolic dysfunction (impaired relaxation). Indeterminate filling pressures. Right Ventricle: The right ventricular size is normal. No increase in right ventricular wall thickness. Right ventricular systolic function is low normal. Tricuspid regurgitation signal is inadequate for assessing PA pressure. Left Atrium: Left atrial size was normal in size. Right Atrium: Right atrial size was normal in size. Pericardium: There is no evidence of pericardial effusion. Mitral Valve: The  mitral valve is abnormal. There is mild thickening of the mitral valve leaflet(s). Trivial mitral valve regurgitation. Tricuspid Valve: The tricuspid valve is grossly normal. Tricuspid valve regurgitation is trivial. Aortic Valve: The aortic valve is tricuspid. Aortic valve regurgitation is not visualized. Pulmonic Valve: The pulmonic valve was normal in structure. Pulmonic valve regurgitation is not visualized. Aorta: The aortic root, ascending aorta, aortic arch and descending aorta are all structurally normal, with no evidence of dilitation or obstruction. Venous: The inferior vena cava is normal in size with greater than 50% respiratory variability, suggesting right atrial pressure of 3 mmHg. IAS/Shunts: No atrial level shunt detected by color flow Doppler.  LEFT VENTRICLE PLAX 2D LVIDd:         3.50 cm  Diastology LVIDs:         2.30 cm  LV e' lateral:   8.16 cm/s LV PW:         0.90 cm  LV E/e' lateral: 10.7 LV IVS:        0.90 cm  LV e' medial:    7.29 cm/s LVOT diam:     2.00 cm  LV E/e' medial:  12.0 LV SV:         56 LV SV Index:   33 LVOT Area:     3.14 cm  RIGHT VENTRICLE RV S prime:     9.36 cm/s TAPSE (M-mode): 1.8 cm LEFT ATRIUM             Index       RIGHT ATRIUM           Index LA diam:        2.90 cm 1.70 cm/m  RA Pressure: 3.00 mmHg LA Vol (A2C):   34.6 ml 20.31 ml/m RA Area:     13.30 cm LA Vol (A4C):   43.1 ml 25.31 ml/m RA Volume:   28.60 ml  16.79 ml/m LA Biplane Vol: 38.4 ml 22.55 ml/m  AORTIC VALVE LVOT Vmax:   73.10 cm/s LVOT Vmean:  53.300 cm/s LVOT VTI:    0.179 m  AORTA Ao Root diam: 2.60 cm Ao Asc diam:  3.30 cm MITRAL VALVE                TRICUSPID VALVE MV Area (PHT): 4.31 cm     Estimated RAP:  3.00 mmHg MV Decel Time: 176 msec MV E velocity: 87.40 cm/s   SHUNTS MV A velocity: 105.00 cm/s  Systemic VTI:  0.18 m MV E/A ratio:  0.83         Systemic Diam: 2.00 cm Lyman Bishop MD Electronically signed by Lyman Bishop MD Signature  Date/Time: 03/06/2020/12:50:40 PM    Final          Scheduled Meds: . ARIPiprazole  15 mg Oral Daily  . aspirin  300 mg Rectal Daily   Or  . aspirin  325 mg Oral Daily  . clopidogrel  75 mg Oral Daily  . DULoxetine  60 mg Oral Daily  . pantoprazole  40 mg Oral BID  . rosuvastatin  10 mg Oral QHS  . traZODone  100 mg Oral QHS   Continuous Infusions:   LOS: 1 day    Time spent: 35 minutes    Edwin Dada, MD [facility] 03/06/2020, 4:21 PM     Please page though [facility] or Epic secure chat:  For [employer], Adult nurse

## 2020-03-06 NOTE — Consult Note (Signed)
Referring Physician: Edwin Dada, MD    Reason for Consult: Stroke  HPI: Cynthia Munoz is an 69 y.o. 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 Right ICA occlusion noted) ordered by her PCP to evaluate left sided numbness / weakness and speech difficulties. She had a tele-neurology consult - Evelena Peat, MD - 03/05/2020 at [facility] ED. The patient was started on aspirin with Plavix and transferred to [facility] for further evaluation and care.  Date last known well: December 2020 (exact day unknown) Time last known well: Time unknown  tPA Given: No - late presentation (>4.5 hours from time of onset).   Past Medical History Past Medical History:  Diagnosis Date  . Anxiety   . COPD (chronic obstructive pulmonary disease) ([place])   . Depression   . GERD (gastroesophageal reflux disease)   . High cholesterol   . Hypertension   . Hypertension   . Shortness of breath   . Skin cancer, basal cell     Surgical History Past Surgical History:  Procedure Laterality Date  . BACK SURGERY    . CHOLECYSTECTOMY    . COLONOSCOPY  August 2010   Dr. Barnabas Lister [place]hour--> sessile polyp removed from the rectum into from the sigmoid colon. Pathology: Hyperplastic   . COLONOSCOPY WITH ESOPHAGOGASTRODUODENOSCOPY (EGD) N/A 08/11/2013   Dr. Oneida Alar: 1. Normal mucosa in the terminal ileum. 2. Mild diveticulosois was noted inthe sigmoid colon.  EGD: 1. abd pain due to PUD 2. No obvious source for diarrhea identified. Negative sprue on duodenal biopsy  . ESOPHAGOGASTRODUODENOSCOPY  August 2013   Dr. Ria Bush Hou--> protocol.c reflux esophagitis. Nonbleeding gastric ulcers. Hiatal hernia. Biopsies negative for H. pylori or Barrett's esophagus. Followup EGD recommended but not done.  .  ESOPHAGOGASTRODUODENOSCOPY N/A 01/06/2014   Dr. Oneida Alar: 1. Small ulcer was found at the pylorus 2. Medium sized ulcer was found in the prepyloric region of the stomach  3. Mild duodenitis. in the second portion of the duodenum. Needs surveillance EGD but did not follow-up until 2016. Neg H.pylori  . ESOPHAGOGASTRODUODENOSCOPY N/A 06/03/2015   Dr. Fields:persistent gastric ulcers due to NSAIDs. No need for surveillance as she has continued to have EGDs noting NSAID-induced ulcers and continues to use NSAIDs.   . Small bowel capsule endoscopy  October 2010   Dr. Barnabas Lister [place]--> normal.   . TUBAL LIGATION      Family History  Family History  Problem Relation Age of Onset  . Colon cancer Paternal Aunt        greater >60  . Lung cancer Father   . Heart disease Father   . Diverticulitis Sister   . Anxiety disorder Sister   . Depression Sister   . Liver disease Neg Hx   . Celiac disease Neg Hx     Social History:   reports that she has been smoking cigarettes. She has a 19.00 pack-year smoking history. She does not have any smokeless tobacco history on file. She reports that she does not drink alcohol or use drugs.  Allergies:  No Known Allergies  Home Medications:  Medications Prior to Admission  Medication Sig Dispense Refill  . albuterol (PROVENTIL HFA;VENTOLIN HFA) 108 (90 BASE) MCG/ACT inhaler Inhale 2 puffs into the lungs every 6 (six) hours as needed. Shortness of breath    . amLODipine (  NORVASC) 10 MG tablet Take 10 mg by mouth daily.    . ARIPiprazole (ABILIFY) 15 MG tablet Take 15 mg by mouth daily.    Marland Kitchen aspirin EC 81 MG tablet Take 243 mg by mouth daily.    . Azelastine HCl 137 MCG/SPRAY SOLN Place 1-2 sprays into both nostrils daily as needed.    . celecoxib (CELEBREX) 100 MG capsule Take 100 mg by mouth daily.    . diclofenac (VOLTAREN) 75 MG EC tablet Take 75 mg by mouth 2 (two) times daily.    . DULoxetine (CYMBALTA) 60 MG capsule Take 60 mg by mouth daily.    Marland Kitchen  levocetirizine (XYZAL) 5 MG tablet Take 5 mg by mouth daily.    Marland Kitchen lisinopril (PRINIVIL,ZESTRIL) 20 MG tablet Take 20 mg by mouth daily.     . pantoprazole (PROTONIX) 40 MG tablet TAKE ONE TABLET BY MOUTH TWICE DAILY (Patient taking differently: Take 40 mg by mouth 2 (two) times daily. ) 180 tablet 3  . rosuvastatin (CRESTOR) 10 MG tablet Take 10 mg by mouth at bedtime.    . traZODone (DESYREL) 100 MG tablet Take 100 mg by mouth at bedtime.      Hospital Medications . ARIPiprazole  15 mg Oral Daily  . aspirin  300 mg Rectal Daily   Or  . aspirin  325 mg Oral Daily  . clopidogrel  75 mg Oral Daily  . DULoxetine  60 mg Oral Daily  . pantoprazole  40 mg Oral BID  . rosuvastatin  10 mg Oral QHS  . traZODone  100 mg Oral QHS    ROS:  History obtained from patient  General ROS: negative for - chills, fatigue, fever, night sweats, weight gain or weight loss Psychological ROS: negative for - behavioral disorder, hallucinations, memory difficulties, mood swings or suicidal ideation Ophthalmic ROS: negative for - blurry vision, double vision, eye pain or loss of vision ENT ROS: negative for - epistaxis, nasal discharge, oral lesions, sore throat, tinnitus or vertigo Allergy and Immunology ROS: negative for - hives or itchy/watery eyes Hematological and Lymphatic ROS: negative for - bleeding problems, bruising or swollen lymph nodes Endocrine ROS: negative for - galactorrhea, hair pattern changes, polydipsia/polyuria or temperature intolerance Respiratory ROS: negative for - cough, hemoptysis, shortness of breath or wheezing Cardiovascular ROS: negative for - chest pain, dyspnea on exertion, edema or irregular heartbeat Gastrointestinal ROS: negative for - abdominal pain, diarrhea, hematemesis, nausea/vomiting or stool incontinence Genito-Urinary ROS: negative for - dysuria, hematuria, incontinence or urinary frequency/urgency Musculoskeletal ROS: negative for - joint swelling or muscular  weakness Neurological ROS: as noted in HPI Dermatological ROS: negative for rash and skin lesion changes   Physical Examination:  Vitals:   03/06/20 0129 03/06/20 0341 03/06/20 0556 03/06/20 0826  BP: (!) 163/69 116/61 (!) 144/58 129/77  Pulse: 69 67 66 60  Resp: 18 15 18 18   Temp: 98.4 F (36.9 C) 98 F (36.7 C) 99.1 F (37.3 C) 98.2 F (36.8 C)  TempSrc: Oral Oral Oral Oral  SpO2: 98% 93% 91% 98%  Weight:      Height:        Physical exam: Exam: Gen: NAD, conversant,  well groomed                     CV: RRR, no MRG. No Carotid Bruits. No peripheral edema, warm, nontender Eyes: Conjunctivae clear without exudates or hemorrhage  Neuro: Detailed Neurologic Exam  Speech:    Speech is normal;  fluent and spontaneous with normal comprehension.  Cognition:    Follows comands    The patient is oriented to person, place, month, date, year;     language fluent;     normal attention, concentration,     fund of knowledge appears impaired Cranial Nerves:    The pupils are equal, round, and reactive to light. Pupils too small for fundoscopy. Blinks to threat bilaterally. Extraocular movements are intact. Trigeminal sensation is intact and the muscles of mastication are normal. The face is symmetric. The palate elevates in the midline. Hearing intact. Voice is normal. Shoulder shrug is normal. The tongue has normal motion without fasciculations.   Coordination:    Normal finger to nose right, mild dysmetria on the left  Gait:    Not tested  Motor Observation:    No asymmetry, no atrophy, and no involuntary movements noted. Tone:    Normal tone, some paratonia in the lowers but was able to get patient to relax and tone appears normal  Posture:    Posture is normal sitting up in bed    Strength:    Strength is V/V right, left antigravity with mild drift     Sensation: intact to LT     Reflex Exam:  DTR's:    Deep tendon reflexes in the upper and lower extremities are  brisk bilaterally.   Toes:    The toes are upgoing bilaterally.   Clonus:    Clonus is absent.     LABORATORY STUDIES:  Basic Metabolic Panel: Recent Labs  Lab 03/05/20 1813  NA 141  K 4.2  CL 110  CO2 24  GLUCOSE 105*  BUN 21  CREATININE 1.12*  CALCIUM 11.0*    Liver Function Tests: No results for input(s): AST, ALT, ALKPHOS, BILITOT, PROT, ALBUMIN in the last 168 hours. No results for input(s): LIPASE, AMYLASE in the last 168 hours. No results for input(s): AMMONIA in the last 168 hours.  CBC: No results for input(s): WBC, NEUTROABS, HGB, HCT, MCV, PLT in the last 168 hours.  Cardiac Enzymes: No results for input(s): CKTOTAL, CKMB, CKMBINDEX, TROPONINI in the last 168 hours.  BNP: Invalid input(s): POCBNP  CBG: No results for input(s): GLUCAP in the last 168 hours.  Microbiology:   Coagulation Studies: Recent Labs    03/05/20 1643  LABPROT 12.7  INR 1.0    Urinalysis: No results for input(s): COLORURINE, LABSPEC, PHURINE, GLUCOSEU, HGBUR, BILIRUBINUR, KETONESUR, PROTEINUR, UROBILINOGEN, NITRITE, LEUKOCYTESUR in the last 168 hours.  Invalid input(s): APPERANCEUR  Lipid Panel:     Component Value Date/Time   CHOL 163 03/06/2020 0448   TRIG 132 03/06/2020 0448   HDL 54 03/06/2020 0448   CHOLHDL 3.0 03/06/2020 0448   VLDL 26 03/06/2020 0448   LDLCALC 83 03/06/2020 0448    HgbA1C:  Lab Results  Component Value Date   HGBA1C 5.6 03/06/2020    Urine Drug Screen:  No results found for: LABOPIA, COCAINSCRNUR, LABBENZ, AMPHETMU, THCU, LABBARB   Alcohol Level:  No results for input(s): ETH in the last 168 hours.  Miscellaneous labs:  EKG  EKG - pending   IMAGING:  CT Angio Head W or Wo Contrast CT Angio Neck W and/or Wo Contrast 03/05/2020 IMPRESSION:  1. 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  2. Flow is reconstituted in the right ICA within the cavernous internal carotid artery.  3. Tandem  high-grade stenosis in the terminal right ICA/proximal right M1 segment.  4. Decreased size  of right M1 segment and MCA branch vessels consistent with decreased perfusion pressure.  5. High-grade stenosis of azygos right A1 segment. The A2 segments are patent.  6. Left MCA is within normal limits.  7. Fetal type posterior cerebral arteries bilaterally. A small P1 segment feeds the right side. The right PCA, MCA, and bilateral ACA is are dependent on this small vessel.   MR ANGIO HEAD WO CONTRAST 03/05/2020 IMPRESSION:  1. Occluded right internal carotid artery.  2. Predominantly fetal type right PCA. Both the right MCA and PCA are now fed by a small P1 segment only.  3. Decreased flow signal in the right MCA branch vessels consistent with attenuated flow.  4. Atherosclerotic changes at the cavernous left ICA without significant stenosis.  5. Fetal type left posterior cerebral artery.   MR BRAIN WO CONTRAST 03/05/2020 IMPRESSION:  1. Subacute nonhemorrhagic watershed territory infarct involving the right ACA watershed territory. This infarct is up to 61 to 41 weeks old.  2. Abnormal signal in the right internal carotid artery through the ICA terminus compatible with high-grade occlusion.  3. Scattered subcortical T2 hyperintensities bilaterally are mildly advanced for age. This likely reflects the sequela of chronic microvascular ischemia.  4. Bilateral mastoid effusions, right greater than left. No obstructing nasopharyngeal lesion is present.   Transthoracic Echocardiogram - pending  Bilateral Carotid Dopplers  02/04/2020 IMPRESSION: Complete right ICA occlusion Left ICA narrowing less than 50% Patent antegrade vertebral flow bilaterally   Assessment:  Cynthia Munoz is a 69 y.o. female with history of COPD, ongoing tobacco use, depression, Htn, Hld, anxiety and "mini stroke" in December 2020 presents with an abnormal outpt MRI (chronic right right hemispheric watershed  distribution infarctions with possible small subacute component. Right ICA occlusion noted) left sided numbness / weakness and speech difficulties. She did not receive IV t-PA due to late presentation (>4.5 hours from time of onset)  Stroke: multiple infarcts - sub acute - embolic - likely from  Rt ICA occlusion / dissection  Code Stroke CT Head - not ordered  CT head - not ordered  MRI head -  Subacute nonhemorrhagic watershed territory infarct involving the right ACA watershed territory. This infarct is up to 54 to 72 weeks old. Abnormal signal in the right internal carotid artery through the ICA terminus compatible with high-grade occlusion.   MRA head - Occluded right internal carotid artery. Predominantly fetal type right PCA. Both the right MCA and PCA are now fed by a small P1 segment only. Decreased flow signal in the right MCA branch vessels consistent with attenuated flow.   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 Flow is reconstituted in the right ICA within the cavernous internal carotid artery. Tandem high-grade stenosis in the terminal right ICA/proximal right M1 segment. Decreased size of right M1 segment and MCA branch vessels consistent with decreased perfusion pressure. High-grade stenosis of azygos right A1 segment.   CT Perfusion - not ordered  Carotid Doppler - 02/04/2020 - Complete right ICA occlusion  2D Echo - normal EF without pfo or cardiac thrombus seen   [employer] Virus 2 - pending  LDL - 83  HgbA1c - 5.6  UDS - not ordered  VTE prophylaxis - SCDs Diet  Diet Order            Diet Heart Room service appropriate? Yes; Fluid consistency: Thin  Diet effective now  aspirin 81 mg daily prior to admission, now on aspirin 325 mg daily  Patient counseled to be compliant with her antithrombotic medications. Recommend discharge on DUAP for 3 months and then Plavix alone and follow up with Dr. Leonie Man  within 6-8 weeks at [facility] for follow up and repeat CTA.   Ongoing aggressive stroke risk factor management  Therapy recommendations:  pending  Disposition:  Pending  Hypertension  Home BP meds: Lisinopril ; amlodipine  Current BP meds: none   Stable . Permissive hypertension (OK if < 220/120) but gradually normalize. . Long-term BP goal normotensive  Hyperlipidemia  Home Lipid lowering medication: Crestor 10 mg daily  LDL 83, goal < 70  Current lipid lowering medication: Crestor 10 mg daily - consider increasing to 20 mg daily  Continue statin at discharge  Other Stroke Risk Factors  Advanced age  Cigarette smoker - advised to stop smoking  Overweight, Body mass index is 26.28 kg/m., recommend weight loss, diet and exercise as appropriate   Other Active Problems  CKD - stage 3a - creatinine - 1.12   Hypercalcemia - 11.0  Plan:  PT consult, OT consult, Speech consult  Vascular consult: no intervention possible  Statin Therapy - LDL goal < 70  Risk factor modification  Telemetry monitoring  Frequent neuro checks  Fall Precautions  NPO until swallowing evaluation has been passed (aspirin suppository if needed)  Recommend discharge on DUAP for 3 months and then Plavix alone and follow up with Dr. Leonie Man within 6-8 weeks at [facility] for follow up and repeat CTA.   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 Recommend DUAP for 3 months then plavix alone. Will need follow up and repeat CTA, recommend Dr. Leonie Man at [facility] in 6-8 weeks.  Personally examined patient and images, and have participated in and made any corrections needed to history, physical, neuro exam,assessment and plan as stated above.  I have personally obtained the history, evaluated lab date, reviewed imaging studies and agree with radiology  interpretations.    Sarina Ill, MD Stroke Neurology   A total of 35 minutes was spent for the care of this patient, spent on counseling patient and family on different diagnostic and therapeutic options, counseling and coordination of care, riskd ans benefits of management, compliance, or risk factor reduction and education.

## 2020-03-06 NOTE — Evaluation (Signed)
Physical Therapy Evaluation Patient Details Name: Cynthia Munoz MRN: [id number] DOB: 1951/05/28 Today's Date: 03/06/2020   History of Present Illness  Cynthia Munoz is a 69 y.o. female with a history of hypertension, COPD, high cholesterol, GERD, peptic ulcer disease.   Presented to ER with concern for abnormal MRI revealing acute to subacute right MCA territory infarct with right carotid occlusion. CT showed right ICA occlusion.     Clinical Impression  Pt did well walking today and her left leg is weaker but her main problem is in her left ankle - she has more weakness in DF and eversion and is tight in her heelcord.  I educated her on increasing heel strike walking and gave her stretching and ankle exercises. She is not weak enough to need AFO but she is tight and weak due to lack of awareness of her deficit over last 3 months.  I recommend HHPT to work with her - to follow up on her ankle strenghtening and to progress to high level balance to get her to return to high level independent status. She should do well physically and functionally and continue to live alone safely.  Pt in agreement to PT plan.    Follow Up Recommendations Home health PT;Supervision - Intermittent    Equipment Recommendations  None recommended by PT    Recommendations for Other Services       Precautions / Restrictions Precautions Precautions: Fall Precaution Comments: pt denies falls - since Dec when she first had weakness Restrictions Weight Bearing Restrictions: No Other Position/Activity Restrictions: pt denied dizziness      Mobility  Bed Mobility Overal bed mobility: Modified Independent Bed Mobility: Supine to Sit     Supine to sit: Modified independent (Device/Increase time)     General bed mobility comments: supervision for safety, increased time and effort  Transfers Overall transfer level: Needs assistance Equipment used: Straight cane Transfers: Sit to/from Stand Sit to Stand:  Supervision         General transfer comment: pt safe with sit to stand and good overall initial standing balance  Ambulation/Gait Ambulation/Gait assistance: Supervision Gait Distance (Feet): 120 Feet Assistive device: Straight cane       General Gait Details: pt with short strides and decreased DF on left.  Pt reports that her left ankle occasionally twists under her and feels like it gives out  Stairs            Wheelchair Mobility    Modified Rankin (Stroke Patients Only) Modified Rankin (Stroke Patients Only) Pre-Morbid Rankin Score: No significant disability Modified Rankin: No significant disability     Balance Overall balance assessment: Mild deficits observed, not formally tested                                           Pertinent Vitals/Pain Pain Assessment: No/denies pain    Home Living Family/patient expects to be discharged to:: Private residence Living Arrangements: Alone Available Help at Discharge: Family;Friend(s);Available PRN/intermittently Type of Home: Mobile home Home Access: Stairs to enter Entrance Stairs-Rails: Can reach both Entrance Stairs-Number of Steps: 4 Home Layout: One level Home Equipment: Cane - single point;Bedside commode      Prior Function Level of Independence: Independent with assistive device(s)         Comments: hasn't been driving since december;family drives pt to appointments and grocery store  Hand Dominance   Dominant Hand: Right    Extremity/Trunk Assessment   Upper Extremity Assessment Upper Extremity Assessment: Defer to OT evaluation LUE Deficits / Details: LUE grossly 4/5 RUE grossly 5/5;decreased in hand manipulation skills, decreased gross motor coordination, decreased rapid alternating movement speed LUE Sensation: WNL LUE Coordination: decreased fine motor;decreased gross motor    Lower Extremity Assessment Lower Extremity Assessment: LLE deficits/detail LLE  Deficits / Details: grossly 4-/5 with 3-/5 DF and eversion with tight heelcords    Cervical / Trunk Assessment Cervical / Trunk Assessment: Kyphotic  Communication   Communication: No difficulties  Cognition Arousal/Alertness: Awake/alert Behavior During Therapy: WFL for tasks assessed/performed Overall Cognitive Status: Within Functional Limits for tasks assessed                                        General Comments General comments (skin integrity, edema, etc.): vital signs stable    Exercises Other Exercises Other Exercises: Pt did heelcord stretches with towel Other Exercises: Pt did ankle circles - left leg - 5 reps clockwise and then 5 reps counter clockwise - hard with DF and eversion.  pt changed directions for 20 reps twice - very hard and had to concentrate. she is going to continue this to help get more ankle strength and control   Assessment/Plan    PT Assessment Patient needs continued PT services  PT Problem List Decreased strength;Decreased activity tolerance;Decreased balance       PT Treatment Interventions Therapeutic activities;Gait training;Therapeutic exercise;Patient/family education;Balance training;Functional mobility training    PT Goals (Current goals can be found in the Care Plan section)  Acute Rehab PT Goals Patient Stated Goal: to get stronger, be able to take care of home independently PT Goal Formulation: With patient Time For Goal Achievement: 03/13/20 Potential to Achieve Goals: Good    Frequency Min 3X/week   Barriers to discharge Decreased caregiver support      Co-evaluation               AM-PAC PT "6 Clicks" Mobility  Outcome Measure   Help needed moving from lying on your back to sitting on the side of a flat bed without using bedrails?: None Help needed moving to and from a bed to a chair (including a wheelchair)?: None Help needed standing up from a chair using your arms (e.g., wheelchair or bedside  chair)?: A Little Help needed to walk in hospital room?: A Little Help needed climbing 3-5 steps with a railing? : A Little 6 Click Score: 17    End of Session Equipment Utilized During Treatment: Gait belt Activity Tolerance: Patient tolerated treatment well Patient left: in chair;with chair alarm set;with call bell/phone within reach Nurse Communication: Mobility status PT Visit Diagnosis: Unsteadiness on feet (R26.81);Other abnormalities of gait and mobility (R26.89);Muscle weakness (generalized) (M62.81)    Time: [id number] PT Time Calculation (min) (ACUTE ONLY): 23 min   Charges:   PT Evaluation $PT Eval Low Complexity: 1 Low PT Treatments $Gait Training: 8-22 mins        03/06/2020   Rande Lawman, PT   Loyal Buba 03/06/2020, 2:02 PM

## 2020-03-06 NOTE — Consult Note (Addendum)
[facility] [place]  Patient name: Cynthia Munoz MRN: [id number] DOB: 24-Jan-1951 Sex: female   REQUESTING PROVIDER:   Hospital service   REASON FOR CONSULT:    Stroke  HISTORY OF PRESENT ILLNESS:   [name] is a 69 y.o. female, who has been having left arm and leg weakness since December.  She has also been having mild altered speech.  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  Follow-up CT angiogram confirmed right carotid occlusion, likely secondary to dissection.  She is on dual antiplatelet therapy  Patient is medically managed for hypertension.  She suffers from COPD and is a current smoker.  She takes a statin for hypercholesterolemia.  PAST MEDICAL HISTORY    Past Medical History:  Diagnosis Date  . Anxiety   . COPD (chronic obstructive pulmonary disease) ([place])   . Depression   . GERD (gastroesophageal reflux disease)   . High cholesterol   . Hypertension   . Hypertension   . Shortness of breath   . Skin cancer, basal cell      FAMILY HISTORY   Family History  Problem Relation Age of Onset  . Colon cancer Paternal Aunt        greater >60  . Lung cancer Father   . Heart disease Father   . Diverticulitis Sister   . Anxiety disorder Sister   . Depression Sister   . Liver disease Neg Hx   . Celiac disease Neg Hx     SOCIAL HISTORY:   Social History   Socioeconomic History  . Marital status: Divorced    Spouse name: Not on file  . Number of children: 2  . Years of education: Not on file  . Highest education level: Not on file  Occupational History  . Not on file  Tobacco Use  . Smoking status: Current Every Day Smoker    Packs/day: 0.50    Years: 38.00    Pack years: 19.00    Types: Cigarettes  . Tobacco comment: "trying to quit"   Substance and Sexual Activity  . Alcohol use: No    Alcohol/week: 0.0 standard  drinks  . Drug use: No  . Sexual activity: Not Currently  Other Topics Concern  . Not on file  Social History Narrative  . Not on file   Social Determinants of Health   Financial Resource Strain:   . Difficulty of Paying Living Expenses:   Food Insecurity:   . Worried About Charity fundraiser in the Last Year:   . Arboriculturist in the Last Year:   Transportation Needs:   . Film/video editor (Medical):   Marland Kitchen Lack of Transportation (Non-Medical):   Physical Activity:   . Days of Exercise per Week:   . Minutes of Exercise per Session:   Stress:   . Feeling of Stress :   Social Connections:   . Frequency of Communication with Friends and Family:   . Frequency of Social Gatherings with Friends and Family:   . Attends Religious Services:   . Active Member of Clubs or Organizations:   . Attends Archivist Meetings:   Marland Kitchen Marital Status:   Intimate Partner Violence:   . Fear of Current or Ex-Partner:   . Emotionally Abused:   Marland Kitchen Physically Abused:   . Sexually Abused:     ALLERGIES:    No Known Allergies  CURRENT MEDICATIONS:    Current Facility-Administered Medications  Medication Dose Route Frequency Provider Last Rate Last Admin  . acetaminophen (TYLENOL) tablet 650 mg  650 mg Oral Q4H PRN Truett Mainland, DO   650 mg at 03/06/20 0222   Or  . acetaminophen (TYLENOL) 160 MG/5ML solution 650 mg  650 mg Per Tube Q4H PRN Truett Mainland, DO       Or  . acetaminophen (TYLENOL) suppository 650 mg  650 mg Rectal Q4H PRN Truett Mainland, DO      . albuterol (PROVENTIL) (2.5 MG/3ML) 0.083% nebulizer solution 3 mL  3 mL Inhalation Q6H PRN Truett Mainland, DO      . ARIPiprazole (ABILIFY) tablet 15 mg  15 mg Oral Daily Truett Mainland, DO   15 mg at 03/06/20 0943  . aspirin suppository 300 mg  300 mg Rectal Daily Truett Mainland, DO       Or  . aspirin tablet 325 mg  325 mg Oral Daily Truett Mainland, DO   325 mg at 03/06/20 0943  . clopidogrel (PLAVIX)  tablet 75 mg  75 mg Oral Daily Truett Mainland, DO   75 mg at 03/06/20 0943  . DULoxetine (CYMBALTA) DR capsule 60 mg  60 mg Oral Daily Truett Mainland, DO   60 mg at 03/06/20 0943  . pantoprazole (PROTONIX) EC tablet 40 mg  40 mg Oral BID Truett Mainland, DO   40 mg at 03/06/20 0943  . rosuvastatin (CRESTOR) tablet 10 mg  10 mg Oral QHS Truett Mainland, DO   10 mg at 03/06/20 [id number]  . traZODone (DESYREL) tablet 100 mg  100 mg Oral QHS Truett Mainland, DO        REVIEW OF SYSTEMS:   [X]  denotes positive finding, [ ]  denotes negative finding Cardiac  Comments:  Chest pain or chest pressure:    Shortness of breath upon exertion:    Short of breath when lying flat:    Irregular heart rhythm:        Vascular    Pain in calf, thigh, or hip brought on by ambulation:    Pain in feet at night that wakes you up from your sleep:     Blood clot in your veins:    Leg swelling:         Pulmonary    Oxygen at home:    Productive cough:     Wheezing:         Neurologic    Sudden weakness in arms or legs:  x   Sudden numbness in arms or legs:     Sudden onset of difficulty speaking or slurred speech: x   Temporary loss of vision in one eye:     Problems with dizziness:         Gastrointestinal    Blood in stool:      Vomited blood:         Genitourinary    Burning when urinating:     Blood in urine:        Psychiatric    Major depression:         Hematologic    Bleeding problems:    Problems with blood clotting too easily:        Skin    Rashes or ulcers:        Constitutional    Fever or chills:     PHYSICAL EXAM:   Vitals:  03/06/20 0129 03/06/20 0341 03/06/20 0556 03/06/20 0826  BP: (!) 163/69 116/61 (!) 144/58 129/77  Pulse: 69 67 66 60  Resp: 18 15 18 18   Temp: 98.4 F (36.9 C) 98 F (36.7 C) 99.1 F (37.3 C) 98.2 F (36.8 C)  TempSrc: Oral Oral Oral Oral  SpO2: 98% 93% 91% 98%  Weight:      Height:        GENERAL: The patient is a well-nourished  female, in no acute distress. The vital signs are documented above. CARDIAC: There is a regular rate and rhythm.  PULMONARY: Nonlabored respirations ABDOMEN: Soft and non-tender with normal pitched bowel sounds.  MUSCULOSKELETAL: There are no major deformities or cyanosis. NEUROLOGIC: left sided weakness and slurred speach SKIN: There are no ulcers or rashes noted. PSYCHIATRIC: The patient has a normal affect.  STUDIES:   I have reviewed her MRI and CTA with the following findings:  MRI: 1. Occluded right internal carotid artery. 2. Predominantly fetal type right PCA. Both the right MCA and PCA are now fed by a small P1 segment only. 3. Decreased flow signal in the right MCA branch vessels consistent with attenuated flow. 4. Atherosclerotic changes at the cavernous left ICA without significant stenosis. 5. Fetal type left posterior cerebral artery.  CTA: 1. 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 2. Flow is reconstituted in the right ICA within the cavernous internal carotid artery. 3. Tandem high-grade stenosis in the terminal right ICA/proximal right M1 segment. 4. Decreased size of right M1 segment and MCA branch vessels consistent with decreased perfusion pressure. 5. High-grade stenosis of azygos right A1 segment. The A2 segments are patent. 6. Left MCA is within normal limits. 7. Fetal type posterior cerebral arteries bilaterally. A small P1 segment feeds the right side. The right PCA, MCA, and bilateral ACA is are dependent on this small vessel. 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  Given that this has been going on for months as well as the fact that her carotid artery is occluded, no vascular intervention is recommended.  I will defer to neurology stroke service for optimizing medical management.  She has atherosclerotic changes without stenosis on the left side.  She will need surveillance  imaging with duplex ultrasound of her left carotid in 6 months which I will schedule with my office.  She will need outpatient speech therapy and physical therapy.   Leia Alf, MD, FACS [facility] [phone] Pager [phone]

## 2020-03-06 NOTE — Progress Notes (Signed)
Echocardiogram 2D Echocardiogram has been performed.  Oneal Deputy Etheleen Valtierra 03/06/2020, 11:52 AM

## 2020-03-06 NOTE — Evaluation (Signed)
Occupational Therapy Evaluation Patient Details Name: Cynthia Munoz MRN: [id number] DOB: [birth year] Today's Date: 03/06/2020    History of Present Illness Cynthia Munoz is a 69 y.o. female with a history of hypertension, COPD, high cholesterol, GERD, peptic ulcer disease.   Presented to ER with concern for abnormal MRI revealing acute to subacute right MCA territory infarct with right carotid occlusion. CT showed right ICA occlusion.    Clinical Impression   PTA, pt was living at home alone with family/friends to assist as needed, pt reports she was independent with ADL and family assisted with some IADL such as driving, grocery shopping, and cleaning. Pt reports having difficulty with home management due to increased pain in her lower back. Pt reports using single point cane for functional mobility. Pt currently requires minguard for functional mobility at spc level. She completed grooming at sink level with minguard for safety and stability. She continues to demonstrate Lsided weakness and decreased coordination. Due to decline in current level of function, pt would benefit from acute OT to address established goals to facilitate safe D/C to venue listed below. At this time, recommend HHOT follow-up. Will continue to follow acutely.     Follow Up Recommendations  Home health OT;Supervision - Intermittent    Equipment Recommendations  None recommended by OT    Recommendations for Other Services       Precautions / Restrictions Precautions Precautions: Fall Restrictions Weight Bearing Restrictions: No      Mobility Bed Mobility Overal bed mobility: Needs Assistance Bed Mobility: Supine to Sit     Supine to sit: Supervision     General bed mobility comments: supervision for safety, increased time and effort  Transfers Overall transfer level: Needs assistance Equipment used: None Transfers: Sit to/from Stand Sit to Stand: Min guard         General transfer comment:  minguard for safety    Balance Overall balance assessment: Mild deficits observed, not formally tested                                         ADL either performed or assessed with clinical judgement   ADL Overall ADL's : Needs assistance/impaired Eating/Feeding: Set up;Sitting   Grooming: Standing;Min guard Grooming Details (indicate cue type and reason): at sink level Upper Body Bathing: Sitting;Set up   Lower Body Bathing: Min guard;Sit to/from stand   Upper Body Dressing : Set up;Sitting   Lower Body Dressing: Min guard;Sit to/from stand   Toilet Transfer: Min guard;Ambulation   Toileting- Clothing Manipulation and Hygiene: Min guard;Sit to/from stand       Functional mobility during ADLs: Min guard;Cane       Vision Baseline Vision/History: Wears glasses Wears Glasses: Reading only Patient Visual Report: No change from baseline Vision Assessment?: Yes Eye Alignment: Within Functional Limits Ocular Range of Motion: Within Functional Limits Alignment/Gaze Preference: Chin down Tracking/Visual Pursuits: Requires cues, head turns, or add eye shifts to track;Unable to hold eye position out of midline;Decreased smoothness of horizontal tracking;Decreased smoothness of vertical tracking Convergence: Impaired (comment)(L eye does not converge ) Visual Fields: No apparent deficits     Perception     Praxis      Pertinent Vitals/Pain Pain Assessment: No/denies pain     Hand Dominance Right   Extremity/Trunk Assessment Upper Extremity Assessment Upper Extremity Assessment: LUE deficits/detail LUE Deficits / Details: LUE grossly 4/5 RUE  grossly 5/5;decreased in hand manipulation skills, decreased gross motor coordination, decreased rapid alternating movement speed LUE Sensation: WNL LUE Coordination: decreased fine motor;decreased gross motor   Lower Extremity Assessment Lower Extremity Assessment: Defer to PT evaluation   Cervical / Trunk  Assessment Cervical / Trunk Assessment: Kyphotic   Communication Communication Communication: No difficulties   Cognition Arousal/Alertness: Awake/alert Behavior During Therapy: WFL for tasks assessed/performed Overall Cognitive Status: Within Functional Limits for tasks assessed                                     General Comments  vss     Exercises     Shoulder Instructions      Home Living Family/patient expects to be discharged to:: Private residence Living Arrangements: Alone Available Help at Discharge: Family;Friend(s);Available PRN/intermittently Type of Home: Mobile home Home Access: Stairs to enter Entrance Stairs-Number of Steps: 4 Entrance Stairs-Rails: Can reach both Home Layout: One level     Bathroom Shower/Tub: Teacher, early years/pre: Standard Bathroom Accessibility: Yes How Accessible: Accessible via walker Home Equipment: Cane - single point;Bedside commode          Prior Functioning/Environment Level of Independence: Independent        Comments: hasn't been driving since december;family drives pt to appointments and grocery store        OT Problem List: Decreased activity tolerance;Impaired balance (sitting and/or standing)      OT Treatment/Interventions: Self-care/ADL training;Therapeutic exercise;Energy conservation;DME and/or AE instruction;Therapeutic activities;Patient/family education;Balance training    OT Goals(Current goals can be found in the care plan section) Acute Rehab OT Goals Patient Stated Goal: to get stronger, be able to take care of home independently OT Goal Formulation: With patient Time For Goal Achievement: 03/20/20 Potential to Achieve Goals: Good ADL Goals Pt Will Perform Grooming: with modified independence;standing Pt Will Transfer to Toilet: with modified independence;ambulating Pt Will Perform Tub/Shower Transfer: with modified independence;Tub transfer Additional ADL Goal #1:  Pt will demonstrate independence with verbalizing signs and symptoms of a stroke using BE FAST.  OT Frequency: Min 2X/week   Barriers to D/C: Decreased caregiver support  pt lives alone, family/friends available prn       Co-evaluation              AM-PAC OT "6 Clicks" Daily Activity     Outcome Measure Help from another person eating meals?: None Help from another person taking care of personal grooming?: A Little Help from another person toileting, which includes using toliet, bedpan, or urinal?: A Little Help from another person bathing (including washing, rinsing, drying)?: A Little Help from another person to put on and taking off regular upper body clothing?: A Little Help from another person to put on and taking off regular lower body clothing?: A Little 6 Click Score: 19   End of Session Equipment Utilized During Treatment: Gait belt;Other (comment)(cane) Nurse Communication: Mobility status  Activity Tolerance: Patient tolerated treatment well Patient left: in chair;with call bell/phone within reach  OT Visit Diagnosis: Unsteadiness on feet (R26.81);Other abnormalities of gait and mobility (R26.89);Muscle weakness (generalized) (M62.81)                Time: 1010-1034 OT Time Calculation (min): 24 min Charges:  OT General Charges $OT Visit: 1 Visit OT Evaluation $OT Eval Moderate Complexity: 1 Mod OT Treatments $Self Care/Home Management : 8-22 mins  Helene Kelp OTR/L Acute Rehabilitation Services Office:  [place] 03/06/2020, 12:50 PM

## 2020-03-07 MED ORDER — ASPIRIN 325 MG PO TABS: 325.0000 mg | ORAL_TABLET | Freq: Every day | ORAL | Status: DC

## 2020-03-07 MED ORDER — CLOPIDOGREL BISULFATE 75 MG PO TABS: 75.0000 mg | ORAL_TABLET | Freq: Every day | ORAL | 3 refills | Status: AC

## 2020-03-07 MED ORDER — ROSUVASTATIN CALCIUM 20 MG PO TABS: 20.0000 mg | ORAL_TABLET | Freq: Every day | ORAL | 3 refills | Status: AC

## 2020-03-07 NOTE — Discharge Summary (Addendum)
Physician Discharge Summary  DEVONE DUGGAR [id number] DOB: 1951-03-07 DOA: 03/05/2020  PCP: Vidal Schwalbe, MD  Admit date: 03/05/2020 Discharge date: 03/07/2020  Admitted From: Home  Disposition:  Home with home health   Recommendations for Outpatient Follow-up:  1. Follow up with PCP Dr. Bartolo Darter in 1-2 weeks 2. Follow up with Neurology Dr. Leonie Man and obtain repeat CTA in 3 months 3. Follow up with Dr. Trula Slade, Vascular surgery in 2 weeks 4. Dr. Bartolo Darter: Please review BP, avoid hypotension; also review arthritis medication if patient needs alternative to NSAIDs   Home Health: PT/OT due to ongoing balance issues due to weakness from stoke  Equipment/Devices: None  Discharge Condition: Good  CODE STATUS: FULL Diet recommendation: Cardiac     Brief/Interim Summary: Mrs. Coville is a 69 y.o. F with hx smoking, COPD, HTN who presented with several months left-sided weakness, needing to walk with a cane, and now MRI abnormality.  Patient diagnosed with TIA in December, but continued to have intermittent left-sided hemiparesis, speech difficulty.  The symptoms persisted so she went back to her PCP who ordered an outpatient MRI which showed an acute to subacute right MCA territory infarct with right carotid occlusion.  So she was sent to the ER.  In the ER she was started on aspirin and Plavix and transferred to come for neurology evaluation.         PRINCIPAL HOSPITAL DIAGNOSIS: Stroke    Discharge Diagnoses:   Acute right MCA infarct Patient admitted after outside MRI showed subacute right sided watershed infarct in ACA, MCA territory.  Non-invasive angiography showed likely dissection right carotid.  Vascular surgery were consulted regarding symptomatic right carotid disease, unfortunately, due to chronicity of symptoms and full occlusion, no revascularization options were possible.  -Echocardiogram showed no cardiogenic source of embolism normal EF, grade 1  diastolic dysfunction -Carotid imaging obtained, see above -Lipids ordered: discharged on Crestor -Aspirin ordered at admission --> continue aspirin and Plavix for three months then Plavix alone -Atrial fibrillation: Not present on telemetry -tPA not given because outside the window -Dysphagia screen ordered in ER -PT eval ordered: recommended outpatient PT -Smoking cessation: Strongly recommended  Smoking  Mood disorder Continue Abilify, duloxetine, trazodone  COPD No active disease  CKD stage IIIa  Hypertension Continue lisinopril, hold amlodipine at discharge.   GERD and history of PUD Continue PPI BID           Discharge Instructions  Discharge Instructions    Ambulatory referral to Neurology   Complete by: As directed    An appointment is requested in approximately: 8 weeks   Diet - low sodium heart healthy   Complete by: As directed    Discharge instructions   Complete by: As directed    From Dr. Loleta Books: You were admitted for stroke symptoms. This appears to be because of a "dissection" of the artery (blood vessel) in your neck on the right side. Dissection means the blood vessel has been damaged.  This is partly related to smoking. You MUST stop smoking. The blood vessel damage was spontaneous though (no injury happened to damage it) The blood vessel damage does not have any fix with surgery or a procedure, but you CAN lower the risk of more stokes by taking aspirin and clopidogrel/Plavix to prevent clots.  Take aspirin 325 mg and clopidogrel/Plavix 75 once daily for the next 3 months After three months, stop aspirin but continue Plavix lifelong unless told otherwise by Dr. Leonie Man (from Neurology) Go see Dr. Leonie Man  in 2-3 months He will arrange a repeat CAT scan of your neck to look at the blood vessel again  Go see Dr. Trula Slade in 2 weeks Both of their information is below in the To Do section, so if you haven't heard from them, call their office  to confirm the appointment time.  Also go see your primary care doctor  Aspirin can irritate the stomach, and should NOT be taken with celecoxib/Celebrex or diclofenac/Voltaren.  STOP taking both of these.  In order to prevent a bleeding ulcer from the aspirin and Plavix combination, continue taking your pantoprazole/Protonix daily  Also, we have increased your cholesterol medicine rosuvastatin/Crestor to 20 mg nightly  Go see your primary care doctor in 1 week Resume your lisinopril medicine for blood pressure Stop taking amlodipine until you see Dr. Bartolo Darter in 1 week   Increase activity slowly   Complete by: As directed      Allergies as of 03/07/2020   No Known Allergies     Medication List    STOP taking these medications   amLODipine 10 MG tablet Commonly known as: NORVASC   aspirin EC 81 MG tablet Replaced by: aspirin 325 MG tablet   celecoxib 100 MG capsule Commonly known as: CELEBREX   diclofenac 75 MG EC tablet Commonly known as: VOLTAREN     TAKE these medications   albuterol 108 (90 Base) MCG/ACT inhaler Commonly known as: VENTOLIN HFA Inhale 2 puffs into the lungs every 6 (six) hours as needed. Shortness of breath   ARIPiprazole 15 MG tablet Commonly known as: ABILIFY Take 15 mg by mouth daily.   aspirin 325 MG tablet Take 1 tablet (325 mg total) by mouth daily. Replaces: aspirin EC 81 MG tablet   Azelastine HCl 137 MCG/SPRAY Soln Place 1-2 sprays into both nostrils daily as needed.   clopidogrel 75 MG tablet Commonly known as: PLAVIX Take 1 tablet (75 mg total) by mouth daily.   DULoxetine 60 MG capsule Commonly known as: CYMBALTA Take 60 mg by mouth daily.   levocetirizine 5 MG tablet Commonly known as: XYZAL Take 5 mg by mouth daily.   lisinopril 20 MG tablet Commonly known as: ZESTRIL Take 20 mg by mouth daily.   pantoprazole 40 MG tablet Commonly known as: PROTONIX TAKE ONE TABLET BY MOUTH TWICE DAILY   rosuvastatin 20 MG  tablet Commonly known as: CRESTOR Take 1 tablet (20 mg total) by mouth at bedtime. What changed:   medication strength  how much to take   traZODone 100 MG tablet Commonly known as: DESYREL Take 100 mg by mouth at bedtime.      Follow-up Information    Vidal Schwalbe, MD. Schedule an appointment as soon as possible for a visit in 1 week(s).   Specialty: Family Medicine Contact information: [address] [place] [address] [phone]        Serafina Mitchell, MD. Go in 2 week(s).   Specialties: Vascular Surgery, Cardiology Contact information: [place] [address] [phone]        Garvin Fila, MD. Go in 2 month(s).   Specialties: Neurology, Radiology Contact information: [address] [place] [address] [place], [facility] Follow up.   Specialty: [place] Why: they will contact you to set up your first home visit in the next 1-2 days Contact information: [place] [address] [phone]  No Known Allergies  Consultations:  Neurology  Vascular surgery   Procedures/Studies: CT Angio Head W or Wo Contrast  Result Date: 03/05/2020 CLINICAL DATA:  Focal neuro deficit for greater than 6 hours. Right ICA occlusion. Left-sided numbness and weakness since December. Recent progression of slurred speech. EXAM: CT ANGIOGRAPHY HEAD AND NECK TECHNIQUE: Multidetector CT imaging of the head and neck was performed using the standard protocol during bolus administration of intravenous contrast. Multiplanar CT image reconstructions and MIPs were obtained to evaluate the vascular anatomy. Carotid stenosis measurements (when applicable) are obtained utilizing NASCET criteria, using the distal internal carotid diameter as the denominator. CONTRAST:  155mL OMNIPAQUE IOHEXOL 350 MG/ML SOLN COMPARISON:  MRI of the head and MRA head 03/05/2020 FINDINGS: CT HEAD  FINDINGS Brain: Right MCA watershed territory infarcts are again seen. No acute hemorrhage is present. There is no significant interval change. Ventricles are of normal size. No significant extraaxial fluid collection is present. The brainstem and cerebellum are within normal limits. Vascular: Atherosclerotic calcifications are present within the cavernous internal carotid arteries and at the dural margin of the vertebral arteries. No hyperdense vessel is present. Skull: Calvarium is intact. No focal lytic or blastic lesions are present. No significant extracranial soft tissue lesion is present. Sinuses: The paranasal sinuses and mastoid air cells are clear. Orbits: The globes and orbits are within normal limits. CTA NECK FINDINGS Aortic arch: A 3 vessel arch configuration is present. Atherosclerotic calcifications are present at the origin of the left subclavian artery without significant stenosis. Additional calcifications are present in the distal arch and proximal descending aorta without aneurysm. Right carotid system: Right common carotid artery is within normal limits. The right internal carotid artery is occluded approximately 2 cm from the bifurcation. Vessel is tapered with faint contrast just above this level until complete occlusion. This suggests dissection. There is no reconstitution in the neck. Left carotid system: The left common carotid artery is within normal limits. Atherosclerotic changes are present at the left carotid bifurcation without significant stenosis. Cervical left ICA is normal. Vertebral arteries: The vertebral arteries originate from the subclavian arteries bilaterally without significant stenosis. Vertebral arteries are codominant. No significant stenosis is present in either vertebral artery in the neck. Skeleton: Endplate degenerative changes are most evident at C5-6 and C6-7. Prominent uncovertebral spurring contributes to foraminal stenosis at C3-4 left. No focal lytic or  blastic lesions are present. Other neck: The soft tissues the neck are otherwise unremarkable. Thyroid is normal. Salivary glands are within normal limits. No significant adenopathy is present. Upper chest: The lung apices are clear. The thoracic inlet is normal. Review of the MIP images confirms the above findings CTA HEAD FINDINGS Anterior circulation: Faint contrast is present within the cavernous right internal carotid artery. There is no contrast in the petrous segment. A tandem high-grade stenosis is present at the terminal right ICA. The right M1 segment is narrowed. MCA bifurcation is intact. The right MCA branch vessels fill. An azygos A1 segment is present from the right with high-grade stenosis and calcification. Both A2 segments fill. The right A1 segment is larger than the left. Distal branch vessels are visualized bilaterally. The left M1 segment is within limits. Left MCA bifurcation and branch vessels are normal. Posterior circulation: High-grade stenosis is present in the right V3 segment. Moderate narrowing is present in left V3 segment. Vertebrobasilar junction is. The basilar artery is small. A P1 segment is present on the right. Fetal type posterior cerebral arteries are otherwise present bilaterally.  PCA branch vessels opacify bilaterally. Venous sinuses: The dural sinuses are patent. The straight sinus and deep cerebral veins are intact. Cortical veins are within limits. Anatomic variants: Fetal type posterior cerebral arteries bilaterally. A small P1 segment is present on the right. Review of the MIP images confirms the above findings IMPRESSION: 1. 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 2. Flow is reconstituted in the right ICA within the cavernous internal carotid artery. 3. Tandem high-grade stenosis in the terminal right ICA/proximal right M1 segment. 4. Decreased size of right M1 segment and MCA branch vessels consistent with decreased perfusion  pressure. 5. High-grade stenosis of azygos right A1 segment. The A2 segments are patent. 6. Left MCA is within normal limits. 7. Fetal type posterior cerebral arteries bilaterally. A small P1 segment feeds the right side. The right PCA, MCA, and bilateral ACA is are dependent on this small vessel. Electronically Signed   By: San Morelle M.D.   On: 03/05/2020 20:06   CT Angio Neck W and/or Wo Contrast  Result Date: 03/05/2020 CLINICAL DATA:  Focal neuro deficit for greater than 6 hours. Right ICA occlusion. Left-sided numbness and weakness since December. Recent progression of slurred speech. EXAM: CT ANGIOGRAPHY HEAD AND NECK TECHNIQUE: Multidetector CT imaging of the head and neck was performed using the standard protocol during bolus administration of intravenous contrast. Multiplanar CT image reconstructions and MIPs were obtained to evaluate the vascular anatomy. Carotid stenosis measurements (when applicable) are obtained utilizing NASCET criteria, using the distal internal carotid diameter as the denominator. CONTRAST:  146mL OMNIPAQUE IOHEXOL 350 MG/ML SOLN COMPARISON:  MRI of the head and MRA head 03/05/2020 FINDINGS: CT HEAD FINDINGS Brain: Right MCA watershed territory infarcts are again seen. No acute hemorrhage is present. There is no significant interval change. Ventricles are of normal size. No significant extraaxial fluid collection is present. The brainstem and cerebellum are within normal limits. Vascular: Atherosclerotic calcifications are present within the cavernous internal carotid arteries and at the dural margin of the vertebral arteries. No hyperdense vessel is present. Skull: Calvarium is intact. No focal lytic or blastic lesions are present. No significant extracranial soft tissue lesion is present. Sinuses: The paranasal sinuses and mastoid air cells are clear. Orbits: The globes and orbits are within normal limits. CTA NECK FINDINGS Aortic arch: A 3 vessel arch configuration  is present. Atherosclerotic calcifications are present at the origin of the left subclavian artery without significant stenosis. Additional calcifications are present in the distal arch and proximal descending aorta without aneurysm. Right carotid system: Right common carotid artery is within normal limits. The right internal carotid artery is occluded approximately 2 cm from the bifurcation. Vessel is tapered with faint contrast just above this level until complete occlusion. This suggests dissection. There is no reconstitution in the neck. Left carotid system: The left common carotid artery is within normal limits. Atherosclerotic changes are present at the left carotid bifurcation without significant stenosis. Cervical left ICA is normal. Vertebral arteries: The vertebral arteries originate from the subclavian arteries bilaterally without significant stenosis. Vertebral arteries are codominant. No significant stenosis is present in either vertebral artery in the neck. Skeleton: Endplate degenerative changes are most evident at C5-6 and C6-7. Prominent uncovertebral spurring contributes to foraminal stenosis at C3-4 left. No focal lytic or blastic lesions are present. Other neck: The soft tissues the neck are otherwise unremarkable. Thyroid is normal. Salivary glands are within normal limits. No significant adenopathy is present. Upper chest: The lung apices are  clear. The thoracic inlet is normal. Review of the MIP images confirms the above findings CTA HEAD FINDINGS Anterior circulation: Faint contrast is present within the cavernous right internal carotid artery. There is no contrast in the petrous segment. A tandem high-grade stenosis is present at the terminal right ICA. The right M1 segment is narrowed. MCA bifurcation is intact. The right MCA branch vessels fill. An azygos A1 segment is present from the right with high-grade stenosis and calcification. Both A2 segments fill. The right A1 segment is larger  than the left. Distal branch vessels are visualized bilaterally. The left M1 segment is within limits. Left MCA bifurcation and branch vessels are normal. Posterior circulation: High-grade stenosis is present in the right V3 segment. Moderate narrowing is present in left V3 segment. Vertebrobasilar junction is. The basilar artery is small. A P1 segment is present on the right. Fetal type posterior cerebral arteries are otherwise present bilaterally. PCA branch vessels opacify bilaterally. Venous sinuses: The dural sinuses are patent. The straight sinus and deep cerebral veins are intact. Cortical veins are within limits. Anatomic variants: Fetal type posterior cerebral arteries bilaterally. A small P1 segment is present on the right. Review of the MIP images confirms the above findings IMPRESSION: 1. 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 2. Flow is reconstituted in the right ICA within the cavernous internal carotid artery. 3. Tandem high-grade stenosis in the terminal right ICA/proximal right M1 segment. 4. Decreased size of right M1 segment and MCA branch vessels consistent with decreased perfusion pressure. 5. High-grade stenosis of azygos right A1 segment. The A2 segments are patent. 6. Left MCA is within normal limits. 7. Fetal type posterior cerebral arteries bilaterally. A small P1 segment feeds the right side. The right PCA, MCA, and bilateral ACA is are dependent on this small vessel. Electronically Signed   By: San Morelle M.D.   On: 03/05/2020 20:06   MR ANGIO HEAD WO CONTRAST  Result Date: 03/05/2020 CLINICAL DATA:  Left hemiparesis EXAM: MRA HEAD WITHOUT CONTRAST TECHNIQUE: Angiographic images of the Circle of Willis were obtained using MRA technique without intravenous contrast. COMPARISON:  None. FINDINGS: The right ICA is occluded. The right common carotid artery is patent. The right M1 segment is patent. Asymmetric flow is present into right MCA  branch vessels, suggesting decreased perfusion. The A1 segments are not visualized. Atherosclerotic changes are noted at the genu of the left ICA without significant stenosis. Left posterior communicating artery is patent. Fetal type left posterior cerebral artery is present. Left MCA branch vessels are normal. There is significant attenuation of proximal A2 segments which could be artifactual. High-grade stenosis is present in the right V3 segment there is some narrowing just beyond the left PICA origin. Basilar artery is normal. Small P1 segment is present on the right, feeding the otherwise fetal type right posterior cerebral artery and the right MCA. IMPRESSION: 1. Occluded right internal carotid artery. 2. Predominantly fetal type right PCA. Both the right MCA and PCA are now fed by a small P1 segment only. 3. Decreased flow signal in the right MCA branch vessels consistent with attenuated flow. 4. Atherosclerotic changes at the cavernous left ICA without significant stenosis. 5. Fetal type left posterior cerebral artery. Electronically Signed   By: San Morelle M.D.   On: 03/05/2020 17:59   MR BRAIN WO CONTRAST  Result Date: 03/05/2020 CLINICAL DATA:  Left hemiparesis. EXAM: MRI HEAD WITHOUT CONTRAST TECHNIQUE: Multiplanar, multiecho pulse sequences of the brain and surrounding  structures were obtained without intravenous contrast. COMPARISON:  None. FINDINGS: Brain: Diffusion signal is evident within the right ACA MCA watershed territory. Intermediate signal is present on the ADC map without true restricted diffusion. Findings are consistent with a subacute nonhemorrhagic watershed territory infarct. No foci of restricted diffusion are present. No significant mass effect is present. There is also no significant loss of volume. Scattered subcortical T2 hyperintensities are present in the left hemisphere. The ventricles are of normal size. Mild white matter changes extend into the brainstem.  Vascular: Abnormal signal is present in the right internal carotid artery through the ICA terminus. Flow is present in the left ICA. Flow is present in the posterior circulation. Skull and upper cervical spine: Degenerative changes are present at C2-3. Craniocervical junction is normal. Midline structures are otherwise unremarkable. Sinuses/Orbits: Bilateral mastoid effusions are present, right greater than left. The paranasal sinuses and mastoid air cells are otherwise clear. The globes and orbits are within normal limits. IMPRESSION: 1. Subacute nonhemorrhagic watershed territory infarct involving the right ACA watershed territory. This infarct is up to 66 to 84 weeks old. 2. Abnormal signal in the right internal carotid artery through the ICA terminus compatible with high-grade occlusion. 3. Scattered subcortical T2 hyperintensities bilaterally are mildly advanced for age. This likely reflects the sequela of chronic microvascular ischemia. 4. Bilateral mastoid effusions, right greater than left. No obstructing nasopharyngeal lesion is present. These results were called by telephone at the time of interpretation on 03/05/2020 at 5:11 pm to provider Dr. Roslynn Amble, who verbally acknowledged these results. Electronically Signed   By: San Morelle M.D.   On: 03/05/2020 17:19   ECHOCARDIOGRAM COMPLETE  Result Date: 03/06/2020    ECHOCARDIOGRAM REPORT   Patient Name:   RHEANNA WEGER Date of Exam: 03/06/2020 Medical Rec #:  [id number]        Height:       63.0 in Accession #:    [id number]       Weight:       148.4 lb Date of Birth:  02/28/1951         BSA:          1.703 m Patient Age:    69 years         BP:           129/77 mmHg Patient Gender: F                HR:           64 bpm. Exam Location:  Inpatient Procedure: Color Doppler, Cardiac Doppler and 2D Echo Indications:    Stroke i163.9  History:        Patient has no prior history of Echocardiogram examinations.                 COPD; Risk  Factors:Hypertension.  Sonographer:    Raquel Sarna Senior RDCS Referring Phys: [place]  1. Left ventricular ejection fraction, by estimation, is 60 to 65%. The left ventricle has normal function. The left ventricle has no regional wall motion abnormalities. Left ventricular diastolic parameters are consistent with Grade I diastolic dysfunction (impaired relaxation).  2. Right ventricular systolic function is low normal. The right ventricular size is normal. Tricuspid regurgitation signal is inadequate for assessing PA pressure.  3. The mitral valve is abnormal. Trivial mitral valve regurgitation.  4. The aortic valve is tricuspid. Aortic valve regurgitation is not visualized.  5. The inferior vena cava is normal in size  with greater than 50% respiratory variability, suggesting right atrial pressure of 3 mmHg. FINDINGS  Left Ventricle: Left ventricular ejection fraction, by estimation, is 60 to 65%. The left ventricle has normal function. The left ventricle has no regional wall motion abnormalities. The left ventricular internal cavity size was normal in size. There is  no left ventricular hypertrophy. Left ventricular diastolic parameters are consistent with Grade I diastolic dysfunction (impaired relaxation). Indeterminate filling pressures. Right Ventricle: The right ventricular size is normal. No increase in right ventricular wall thickness. Right ventricular systolic function is low normal. Tricuspid regurgitation signal is inadequate for assessing PA pressure. Left Atrium: Left atrial size was normal in size. Right Atrium: Right atrial size was normal in size. Pericardium: There is no evidence of pericardial effusion. Mitral Valve: The mitral valve is abnormal. There is mild thickening of the mitral valve leaflet(s). Trivial mitral valve regurgitation. Tricuspid Valve: The tricuspid valve is grossly normal. Tricuspid valve regurgitation is trivial. Aortic Valve: The aortic valve is tricuspid.  Aortic valve regurgitation is not visualized. Pulmonic Valve: The pulmonic valve was normal in structure. Pulmonic valve regurgitation is not visualized. Aorta: The aortic root, ascending aorta, aortic arch and descending aorta are all structurally normal, with no evidence of dilitation or obstruction. Venous: The inferior vena cava is normal in size with greater than 50% respiratory variability, suggesting right atrial pressure of 3 mmHg. IAS/Shunts: No atrial level shunt detected by color flow Doppler.  LEFT VENTRICLE PLAX 2D LVIDd:         3.50 cm  Diastology LVIDs:         2.30 cm  LV e' lateral:   8.16 cm/s LV PW:         0.90 cm  LV E/e' lateral: 10.7 LV IVS:        0.90 cm  LV e' medial:    7.29 cm/s LVOT diam:     2.00 cm  LV E/e' medial:  12.0 LV SV:         56 LV SV Index:   33 LVOT Area:     3.14 cm  RIGHT VENTRICLE RV S prime:     9.36 cm/s TAPSE (M-mode): 1.8 cm LEFT ATRIUM             Index       RIGHT ATRIUM           Index LA diam:        2.90 cm 1.70 cm/m  RA Pressure: 3.00 mmHg LA Vol (A2C):   34.6 ml 20.31 ml/m RA Area:     13.30 cm LA Vol (A4C):   43.1 ml 25.31 ml/m RA Volume:   28.60 ml  16.79 ml/m LA Biplane Vol: 38.4 ml 22.55 ml/m  AORTIC VALVE LVOT Vmax:   73.10 cm/s LVOT Vmean:  53.300 cm/s LVOT VTI:    0.179 m  AORTA Ao Root diam: 2.60 cm Ao Asc diam:  3.30 cm MITRAL VALVE                TRICUSPID VALVE MV Area (PHT): 4.31 cm     Estimated RAP:  3.00 mmHg MV Decel Time: 176 msec MV E velocity: 87.40 cm/s   SHUNTS MV A velocity: 105.00 cm/s  Systemic VTI:  0.18 m MV E/A ratio:  0.83         Systemic Diam: 2.00 cm Lyman Bishop MD Electronically signed by Lyman Bishop MD Signature Date/Time: 03/06/2020/12:50:40 PM    Final  Subjective: Feeling well.  No weakness, no speech change, no confusion, no seizure, no LOC.  Discharge Exam: Vitals:   03/07/20 0335 03/07/20 0746  BP: 121/60 (!) 141/65  Pulse: 62 62  Resp: 16 18  Temp: 98 F (36.7 C) 98 F (36.7 C)  SpO2:  96% 95%   Vitals:   03/06/20 1948 03/06/20 2309 03/07/20 0335 03/07/20 0746  BP: 133/73 (!) 144/61 121/60 (!) 141/65  Pulse: 72 (!) 59 62 62  Resp: 16 16 16 18   Temp: 98.3 F (36.8 C) 97.9 F (36.6 C) 98 F (36.7 C) 98 F (36.7 C)  TempSrc: Oral  Oral Oral  SpO2: 97% 95% 96% 95%  Weight:      Height:        General: Pt is alert, awake, not in acute distress Cardiovascular: RRR, nl S1-S2, no murmurs appreciated.   No LE edema.   Respiratory: Normal respiratory rate and rhythm.  CTAB without rales or wheezes. Abdominal: Abdomen soft and non-tender.  No distension or HSM.   Neuro/Psych: Strength symmetric in upper and lower extremities.  Judgment and insight appear normal.   The results of significant diagnostics from this hospitalization (including imaging, microbiology, ancillary and laboratory) are listed below for reference.     Microbiology: Recent Results (from the past 240 hour(s))  SARS CORONAVIRUS 2 (TAT 6-24 HRS) Nasopharyngeal Nasopharyngeal Swab     Status: None   Collection Time: 03/05/20  8:27 PM   Specimen: Nasopharyngeal Swab  Result Value Ref Range Status   SARS Coronavirus 2 NEGATIVE NEGATIVE Final    Comment: (NOTE) SARS-CoV-2 target nucleic acids are NOT DETECTED. The SARS-CoV-2 RNA is generally detectable in upper and lower respiratory specimens during the acute phase of infection. Negative results do not preclude SARS-CoV-2 infection, do not rule out co-infections with other pathogens, and should not be used as the sole basis for treatment or other patient management decisions. Negative results must be combined with clinical observations, patient history, and epidemiological information. The expected result is Negative. Fact Sheet for Patients: SugarRoll.be Fact Sheet for Healthcare Providers: https://www.woods-mathews.com/ This test is not yet approved or cleared by the [place] FDA and  has been  authorized for detection and/or diagnosis of SARS-CoV-2 by FDA under an Emergency Use Authorization (EUA). This EUA will remain  in effect (meaning this test can be used) for the duration of the COVID-19 declaration under Section 56 4(b)(1) of the Act, 21 U.S.C. section 360bbb-3(b)(1), unless the authorization is terminated or revoked sooner. Performed at [facility], [place] [address], [place],  [address]      Labs: BNP (last 3 results) No results for input(s): BNP in the last 8760 hours. Basic Metabolic Panel: Recent Labs  Lab 03/05/20 1813  NA 141  K 4.2  CL 110  CO2 24  GLUCOSE 105*  BUN 21  CREATININE 1.12*  CALCIUM 11.0*   Liver Function Tests: No results for input(s): AST, ALT, ALKPHOS, BILITOT, PROT, ALBUMIN in the last 168 hours. No results for input(s): LIPASE, AMYLASE in the last 168 hours. No results for input(s): AMMONIA in the last 168 hours. CBC: No results for input(s): WBC, NEUTROABS, HGB, HCT, MCV, PLT in the last 168 hours. Cardiac Enzymes: No results for input(s): CKTOTAL, CKMB, CKMBINDEX, TROPONINI in the last 168 hours. BNP: Invalid input(s): POCBNP CBG: No results for input(s): GLUCAP in the last 168 hours. D-Dimer No results for input(s): DDIMER in the last 72 hours. Hgb A1c Recent Labs  03/06/20 0448  HGBA1C 5.6   Lipid Profile Recent Labs    03/06/20 0448  CHOL 163  HDL 54  LDLCALC 83  TRIG 132  CHOLHDL 3.0   Thyroid function studies No results for input(s): TSH, T4TOTAL, T3FREE, THYROIDAB in the last 72 hours.  Invalid input(s): FREET3 Anemia work up No results for input(s): VITAMINB12, FOLATE, FERRITIN, TIBC, IRON, RETICCTPCT in the last 72 hours. Urinalysis No results found for: COLORURINE, APPEARANCEUR, [place], [place], [place], [place], [place], [place], PROTEINUR, UROBILINOGEN, NITRITE, LEUKOCYTESUR Sepsis Labs Invalid input(s): PROCALCITONIN,  WBC,  LACTICIDVEN Microbiology Recent Results (from the  past 240 hour(s))  SARS CORONAVIRUS 2 (TAT 6-24 HRS) Nasopharyngeal Nasopharyngeal Swab     Status: None   Collection Time: 03/05/20  8:27 PM   Specimen: Nasopharyngeal Swab  Result Value Ref Range Status   SARS Coronavirus 2 NEGATIVE NEGATIVE Final    Comment: (NOTE) SARS-CoV-2 target nucleic acids are NOT DETECTED. The SARS-CoV-2 RNA is generally detectable in upper and lower respiratory specimens during the acute phase of infection. Negative results do not preclude SARS-CoV-2 infection, do not rule out co-infections with other pathogens, and should not be used as the sole basis for treatment or other patient management decisions. Negative results must be combined with clinical observations, patient history, and epidemiological information. The expected result is Negative. Fact Sheet for Patients: SugarRoll.be Fact Sheet for Healthcare Providers: https://www.woods-mathews.com/ This test is not yet approved or cleared by the [place] FDA and  has been authorized for detection and/or diagnosis of SARS-CoV-2 by FDA under an Emergency Use Authorization (EUA). This EUA will remain  in effect (meaning this test can be used) for the duration of the COVID-19 declaration under Section 56 4(b)(1) of the Act, 21 U.S.C. section 360bbb-3(b)(1), unless the authorization is terminated or revoked sooner. Performed at [facility], [place] [address] [place], [place] [address]      Time coordinating discharge: 35 minutes The [place] controlled substances registry was reviewed for this patient        SIGNED:   Edwin Dada, MD  [facility] 03/07/2020, 11:11 AM

## 2020-03-07 NOTE — TOC Transition Note (Addendum)
Transition of Care [facility]) - CM/SW Discharge Note   Patient Details  Name: Cynthia Munoz MRN: [id number] Date of Birth: 1951/02/01  [facility]) CM/SW Contact:  Carles Collet, RN Phone Number: 03/07/2020, 9:43 AM   Clinical Narrative:    Damaris Schooner w patient on the phone. SHe states that she will DC to address on file, no DME needs, is interested in [facility] Discussed Medicare ratings, would like to use [facility]. Referral placed to [facility] Sister will be providing ride home. No other CM needs identified.  10:20 Still waiting for [facility] office to respond  10:50 Still no conformation from [place], reached out to [facility].   11:00 Alvis Lemmings accepted   Final next level of care: [place] Barriers to Discharge: No Barriers Identified   Patient Goals and CMS Choice Patient states their goals for this hospitalization and ongoing recovery are:: to go home CMS Medicare.gov Compare Post Acute Care list provided to:: Patient Choice offered to / list presented to : Patient  Discharge Placement                       Discharge Plan and Services                          HH Arranged: PT, OT [facility] Agency: [place] Date [facility] Agency Contacted: 03/07/20 Time [place]: [phone] Representative spoke with at [place]: [place] (Petronila) Interventions     Readmission Risk Interventions No flowsheet data found.

## 2020-03-07 NOTE — Progress Notes (Signed)
AVS and discharge instructions reviewed with patient and sister. IV and tele removed without complication. Pt discharged with all belongings including stroke book, cell phone, and cane.

## 2020-04-28 ENCOUNTER — Inpatient Hospital Stay: Payer: Self-pay | Admitting: Neurology

## 2020-08-13 IMAGING — CT CT ANGIO NECK
2 of 12 series · 6 of 33 positions shown · IV contrast (Omnipaque or Isovue)
Comparison: MRI of the head and MRA head 03/05/2020

CLINICAL DATA: Focal neuro deficit for greater than 6 hours. Right
ICA occlusion. Left-sided numbness and weakness since [REDACTED].
Recent progression of slurred speech.

EXAM:
CT ANGIOGRAPHY HEAD AND NECK
TECHNIQUE: Multidetector CT imaging of the head and neck was performed using
the standard protocol during bolus administration of intravenous
contrast. Multiplanar CT image reconstructions and MIPs were
obtained to evaluate the vascular anatomy. Carotid stenosis
measurements (when applicable) are obtained utilizing NASCET
criteria, using the distal internal carotid diameter as the
denominator.
CONTRAST:  100mL OMNIPAQUE IOHEXOL 350 MG/ML SOLN

[Series 9: cta head & neck · axial · 0.39mm/px · z∈[+1502,+1697]mm · 4 of 651 slices shown]
[im 131/651  soft-tissue]
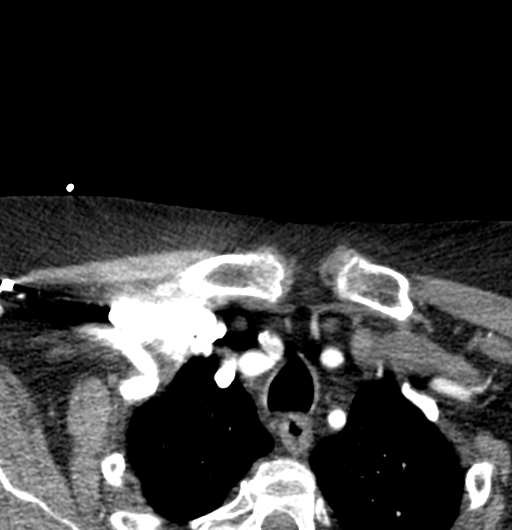
[im 261/651  bone]
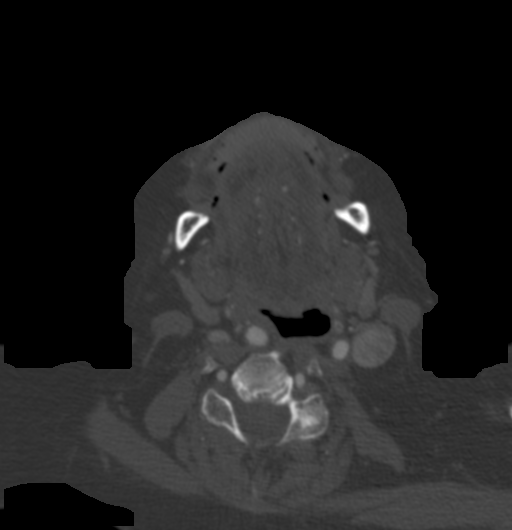
[im 391/651  soft-tissue]
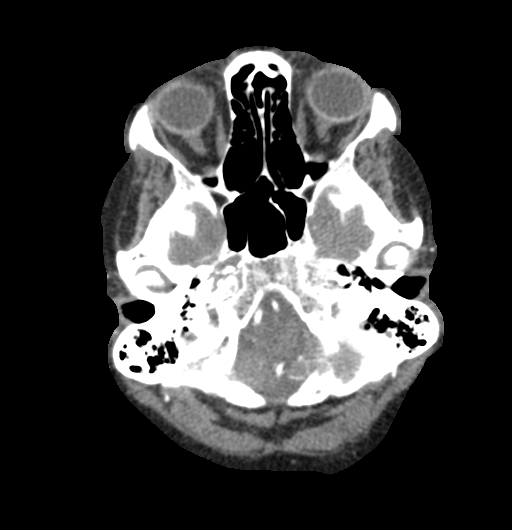
[im 521/651  bone]
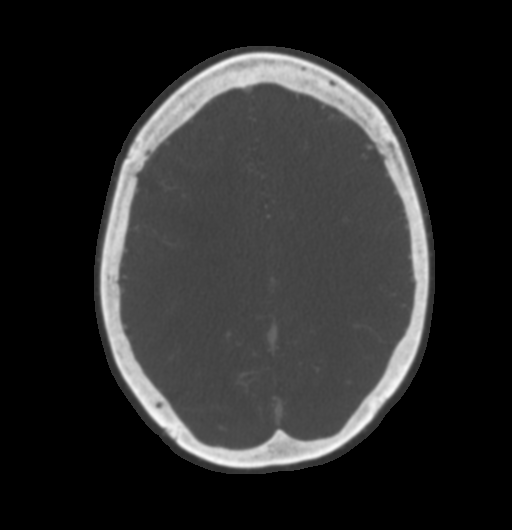

[Series 10: ax thins · axial · 0.39mm/px · z∈[+1545,+1653]mm · 2 of 326 slices shown]
[im 109/326  soft-tissue]
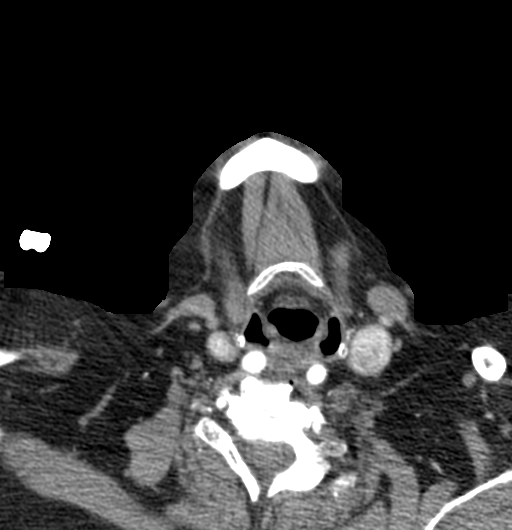
[im 217/326  soft-tissue]
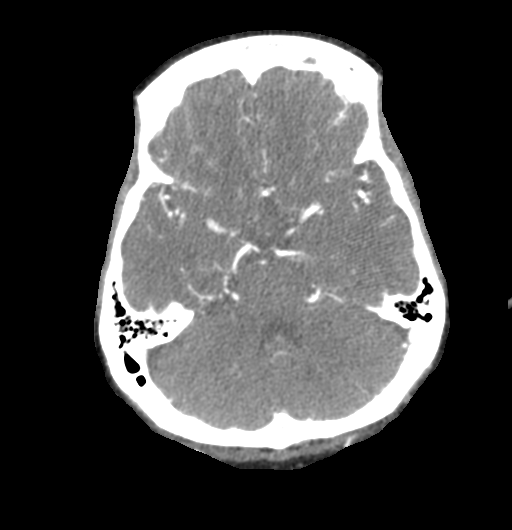

[6 of 33 positions shown; findings below may reference images not displayed]

FINDINGS: CT HEAD FINDINGS

Brain: Right MCA watershed territory infarcts are again seen. No
acute hemorrhage is present. There is no significant interval
change. Ventricles are of normal size. No significant extraaxial
fluid collection is present. The brainstem and cerebellum are within
normal limits.

Vascular: Atherosclerotic calcifications are present within the
cavernous internal carotid arteries and at the dural margin of the
vertebral arteries. No hyperdense vessel is present.

Skull: Calvarium is intact. No focal lytic or blastic lesions are
present. No significant extracranial soft tissue lesion is present.

Sinuses: The paranasal sinuses and mastoid air cells are clear.

Orbits: The globes and orbits are within normal limits.

CTA NECK FINDINGS

Aortic arch: A 3 vessel arch configuration is present.
Atherosclerotic calcifications are present at the origin of the left
subclavian artery without significant stenosis. Additional
calcifications are present in the distal arch and proximal
descending aorta without aneurysm.

Right carotid system: Right common carotid artery is within normal
limits. The right internal carotid artery is occluded approximately
2 cm from the bifurcation. Vessel is tapered with faint contrast
just above this level until complete occlusion. This suggests
dissection. There is no reconstitution in the neck.

Left carotid system: The left common carotid artery is within normal
limits. Atherosclerotic changes are present at the left carotid
bifurcation without significant stenosis. Cervical left ICA is
normal.

Vertebral arteries: The vertebral arteries originate from the
subclavian arteries bilaterally without significant stenosis.
Vertebral arteries are codominant. No significant stenosis is
present in either vertebral artery in the neck.

Skeleton: Endplate degenerative changes are most evident at C5-6 and
C6-7. Prominent uncovertebral spurring contributes to foraminal
stenosis at C3-4 left. No focal lytic or blastic lesions are
present.

Other neck: The soft tissues the neck are otherwise unremarkable.
Thyroid is normal. Salivary glands are within normal limits. No
significant adenopathy is present.

Upper chest: The lung apices are clear. The thoracic inlet is
normal.

Review of the MIP images confirms the above findings

CTA HEAD FINDINGS

Anterior circulation: Faint contrast is present within the cavernous
right internal carotid artery. There is no contrast in the petrous
segment. A tandem high-grade stenosis is present at the terminal
right ICA. The right M1 segment is narrowed. MCA bifurcation is
intact. The right MCA branch vessels fill. An azygos A1 segment is
present from the right with high-grade stenosis and calcification.
Both A2 segments fill. The right A1 segment is larger than the left.
Distal branch vessels are visualized bilaterally.

The left M1 segment is within limits. Left MCA bifurcation and
branch vessels are normal.

Posterior circulation: High-grade stenosis is present in the right
V3 segment. Moderate narrowing is present in left V3 segment.
Vertebrobasilar junction is. The basilar artery is small. A P1
segment is present on the right. Fetal type posterior cerebral
arteries are otherwise present bilaterally. PCA branch vessels
opacify bilaterally.

Venous sinuses: The dural sinuses are patent. The straight sinus and
deep cerebral veins are intact. Cortical veins are within limits.

Anatomic variants: Fetal type posterior cerebral arteries
bilaterally. A small P1 segment is present on the right.

Review of the MIP images confirms the above findings
IMPRESSION: 1. Right ICA occlusion, likely dissection without reconstitution in
the neck.
2. Flow is reconstituted in the right ICA within the cavernous
internal carotid artery.
3. Tandem high-grade stenosis in the terminal right ICA/proximal
right M1 segment.
4. Decreased size of right M1 segment and MCA branch vessels
consistent with decreased perfusion pressure.
5. High-grade stenosis of azygos right A1 segment. The A2 segments
are patent.
6. Left MCA is within normal limits.
7. Fetal type posterior cerebral arteries bilaterally. A small P1
segment feeds the right side. The right PCA, MCA, and bilateral ACA
is are dependent on this small vessel.

## 2020-08-13 IMAGING — CT CT ANGIO HEAD
2 of 11 series · 7 of 33 positions shown · IV contrast (Omnipaque or Isovue)
Comparison: MRI of the head and MRA head 03/05/2020

CLINICAL DATA: Focal neuro deficit for greater than 6 hours. Right
ICA occlusion. Left-sided numbness and weakness since [REDACTED].
Recent progression of slurred speech.

EXAM:
CT ANGIOGRAPHY HEAD AND NECK
TECHNIQUE: Multidetector CT imaging of the head and neck was performed using
the standard protocol during bolus administration of intravenous
contrast. Multiplanar CT image reconstructions and MIPs were
obtained to evaluate the vascular anatomy. Carotid stenosis
measurements (when applicable) are obtained utilizing NASCET
criteria, using the distal internal carotid diameter as the
denominator.
CONTRAST:  100mL OMNIPAQUE IOHEXOL 350 MG/ML SOLN

[Series 8: cta head & neck · axial · 0.47mm/px · z∈[+1545,+1653]mm · 2 of 164 slices shown]
[im 55/164  soft-tissue]
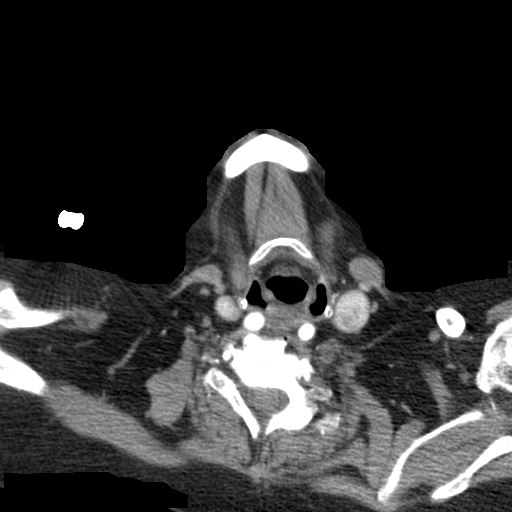
[im 109/164  soft-tissue]
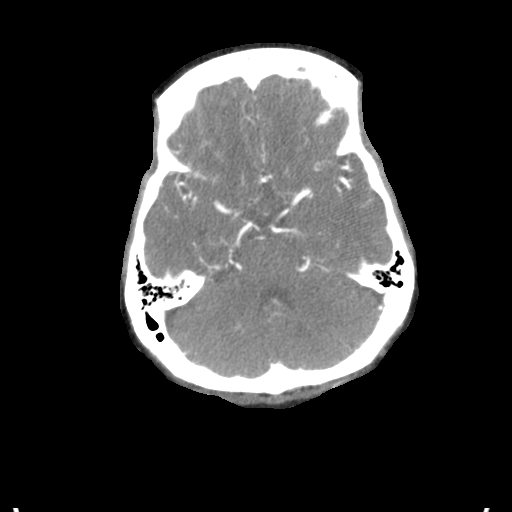

[Series 10: ax thins · axial · 0.39mm/px · z∈[+1491,+1707]mm · 5 of 326 slices shown]
[im 55/326  soft-tissue]
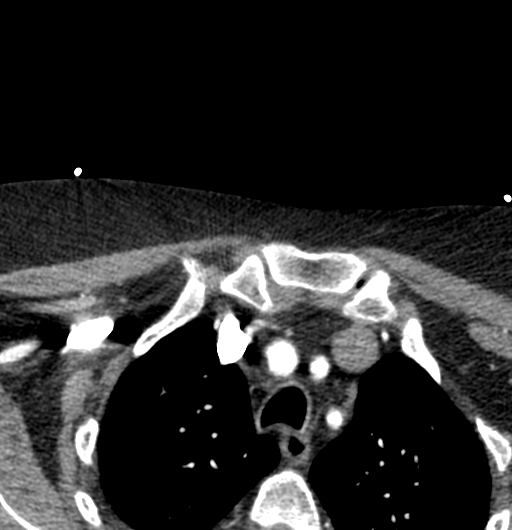
[im 109/326  bone]
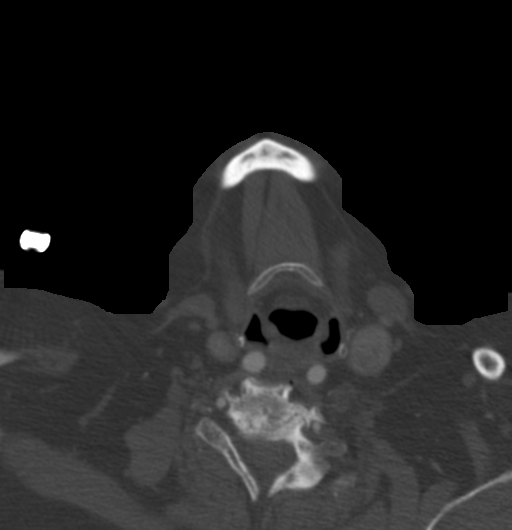
[im 163/326  soft-tissue]
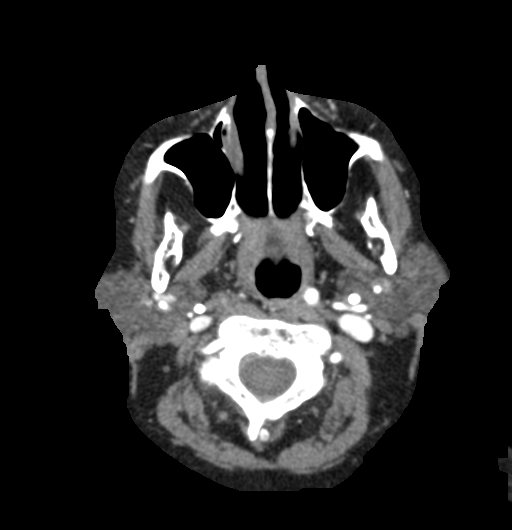
[im 217/326  bone]
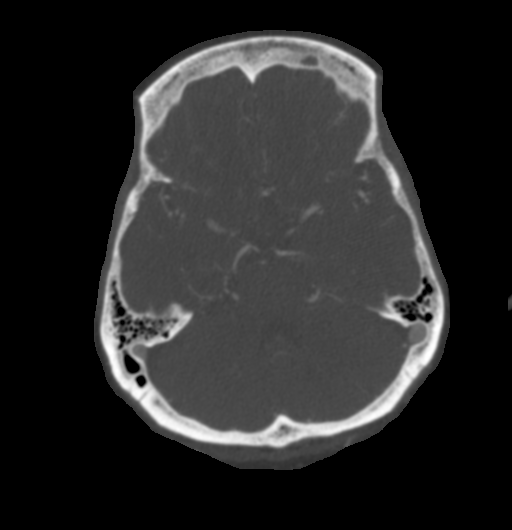
[im 271/326  soft-tissue]
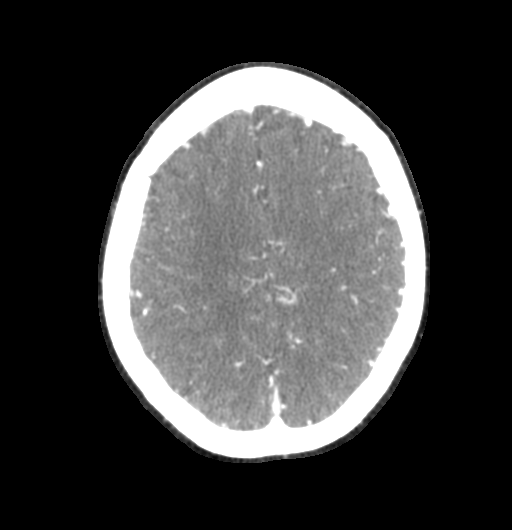

[7 of 33 positions shown; findings below may reference images not displayed]

FINDINGS: CT HEAD FINDINGS

Brain: Right MCA watershed territory infarcts are again seen. No
acute hemorrhage is present. There is no significant interval
change. Ventricles are of normal size. No significant extraaxial
fluid collection is present. The brainstem and cerebellum are within
normal limits.

Vascular: Atherosclerotic calcifications are present within the
cavernous internal carotid arteries and at the dural margin of the
vertebral arteries. No hyperdense vessel is present.

Skull: Calvarium is intact. No focal lytic or blastic lesions are
present. No significant extracranial soft tissue lesion is present.

Sinuses: The paranasal sinuses and mastoid air cells are clear.

Orbits: The globes and orbits are within normal limits.

CTA NECK FINDINGS

Aortic arch: A 3 vessel arch configuration is present.
Atherosclerotic calcifications are present at the origin of the left
subclavian artery without significant stenosis. Additional
calcifications are present in the distal arch and proximal
descending aorta without aneurysm.

Right carotid system: Right common carotid artery is within normal
limits. The right internal carotid artery is occluded approximately
2 cm from the bifurcation. Vessel is tapered with faint contrast
just above this level until complete occlusion. This suggests
dissection. There is no reconstitution in the neck.

Left carotid system: The left common carotid artery is within normal
limits. Atherosclerotic changes are present at the left carotid
bifurcation without significant stenosis. Cervical left ICA is
normal.

Vertebral arteries: The vertebral arteries originate from the
subclavian arteries bilaterally without significant stenosis.
Vertebral arteries are codominant. No significant stenosis is
present in either vertebral artery in the neck.

Skeleton: Endplate degenerative changes are most evident at C5-6 and
C6-7. Prominent uncovertebral spurring contributes to foraminal
stenosis at C3-4 left. No focal lytic or blastic lesions are
present.

Other neck: The soft tissues the neck are otherwise unremarkable.
Thyroid is normal. Salivary glands are within normal limits. No
significant adenopathy is present.

Upper chest: The lung apices are clear. The thoracic inlet is
normal.

Review of the MIP images confirms the above findings

CTA HEAD FINDINGS

Anterior circulation: Faint contrast is present within the cavernous
right internal carotid artery. There is no contrast in the petrous
segment. A tandem high-grade stenosis is present at the terminal
right ICA. The right M1 segment is narrowed. MCA bifurcation is
intact. The right MCA branch vessels fill. An azygos A1 segment is
present from the right with high-grade stenosis and calcification.
Both A2 segments fill. The right A1 segment is larger than the left.
Distal branch vessels are visualized bilaterally.

The left M1 segment is within limits. Left MCA bifurcation and
branch vessels are normal.

Posterior circulation: High-grade stenosis is present in the right
V3 segment. Moderate narrowing is present in left V3 segment.
Vertebrobasilar junction is. The basilar artery is small. A P1
segment is present on the right. Fetal type posterior cerebral
arteries are otherwise present bilaterally. PCA branch vessels
opacify bilaterally.

Venous sinuses: The dural sinuses are patent. The straight sinus and
deep cerebral veins are intact. Cortical veins are within limits.

Anatomic variants: Fetal type posterior cerebral arteries
bilaterally. A small P1 segment is present on the right.

Review of the MIP images confirms the above findings
IMPRESSION: 1. Right ICA occlusion, likely dissection without reconstitution in
the neck.
2. Flow is reconstituted in the right ICA within the cavernous
internal carotid artery.
3. Tandem high-grade stenosis in the terminal right ICA/proximal
right M1 segment.
4. Decreased size of right M1 segment and MCA branch vessels
consistent with decreased perfusion pressure.
5. High-grade stenosis of azygos right A1 segment. The A2 segments
are patent.
6. Left MCA is within normal limits.
7. Fetal type posterior cerebral arteries bilaterally. A small P1
segment feeds the right side. The right PCA, MCA, and bilateral ACA
is are dependent on this small vessel.

## 2020-09-06 ENCOUNTER — Encounter: Payer: Self-pay | Admitting: Internal Medicine

## 2020-10-07 ENCOUNTER — Ambulatory Visit: Payer: Medicare HMO | Admitting: Internal Medicine

## 2020-10-07 ENCOUNTER — Other Ambulatory Visit: Payer: Self-pay

## 2020-10-07 ENCOUNTER — Encounter: Payer: Self-pay | Admitting: Internal Medicine

## 2020-10-07 DIAGNOSIS — K219 Gastro-esophageal reflux disease without esophagitis: Secondary | ICD-10-CM

## 2020-10-07 DIAGNOSIS — R1310 Dysphagia, unspecified: Secondary | ICD-10-CM | POA: Diagnosis not present

## 2020-10-07 NOTE — Patient Instructions (Signed)
We will schedule you for EGD with possible esophageal dilation today in clinic.  Continue on Protonix 40 mg twice daily for now.  We may make adjustments to this pending endoscopic findings.  At [facility] we value your feedback. You may receive a survey about your visit today. Please share your experience as we strive to create trusting relationships with our patients to provide genuine, compassionate, quality care.  We appreciate your understanding and patience as we review any laboratory studies, imaging, and other diagnostic tests that are ordered as we care for you. Our office policy is 5 business days for review of these results, and any emergent or urgent results are addressed in a timely manner for your best interest. If you do not hear from our office in 1 week, please contact us.   We also encourage the use of MyChart, which contains your medical information for your review as well. If you are not enrolled in this feature, an access code is on this after visit summary for your convenience. Thank you for allowing [place] to be involved in your care.  It was great to see you today!  I hope you have a great rest of your fall!!    Seleena Reimers K. Abbey Chatters, D.O. Gastroenterology and Hepatology [facility]

## 2020-10-07 NOTE — Progress Notes (Signed)
Referring Provider: Vidal Schwalbe, MD Primary Care Physician:  Vidal Schwalbe, MD Primary GI:  Dr. Abbey Chatters  Chief Complaint  Patient presents with  . Dysphagia    x 10 months    HPI:   Cynthia Munoz is a 69 y.o. female who presents to the clinic today with multiple GI complaints.  She states she had a stroke in January February of this year.  Since that time she has had worsening dysphagia occurs primarily with solids and liquids both.  Feels as though things will get stuck in her esophagus.  She was placed on Protonix 40 mg twice daily with no relief in her symptoms.  Denies any epigastric pain.  Does note heartburn that has improved with the Protonix.  No radiation of her symptoms.  No history of H. pylori or PUD that she knows of.  No chronic NSAID use.  She does take ASA and Plavix for secondary stroke prevention.  Denies any melena hematochezia.  No change in bowel habits.  From a colon cancer screening perspective, she is due for colonoscopy in 2024.  Past Medical History:  Diagnosis Date  . Anxiety   . COPD (chronic obstructive pulmonary disease) ([place])   . Depression   . GERD (gastroesophageal reflux disease)   . High cholesterol   . Hypertension   . Hypertension   . Shortness of breath   . Skin cancer, basal cell     Past Surgical History:  Procedure Laterality Date  . BACK SURGERY    . CHOLECYSTECTOMY    . COLONOSCOPY  August 2010   Dr. Barnabas Lister [place]--> sessile polyp removed from the rectum into from the sigmoid colon. Pathology: Hyperplastic   . COLONOSCOPY WITH ESOPHAGOGASTRODUODENOSCOPY (EGD) N/A 08/11/2013   Dr. Oneida Alar: 1. Normal mucosa in the terminal ileum. 2. Mild diveticulosois was noted inthe sigmoid colon.  EGD: 1. abd pain due to PUD 2. No obvious source for diarrhea identified. Negative sprue on duodenal biopsy  . ESOPHAGOGASTRODUODENOSCOPY  August 2013   Dr. Ria Bush Hou--> protocol.c reflux esophagitis. Nonbleeding gastric ulcers. Hiatal hernia.  Biopsies negative for H. pylori or Barrett's esophagus. Followup EGD recommended but not done.  . ESOPHAGOGASTRODUODENOSCOPY N/A 01/06/2014   Dr. Oneida Alar: 1. Small ulcer was found at the pylorus 2. Medium sized ulcer was found in the prepyloric region of the stomach  3. Mild duodenitis. in the second portion of the duodenum. Needs surveillance EGD but did not follow-up until 2016. Neg H.pylori  . ESOPHAGOGASTRODUODENOSCOPY N/A 06/03/2015   Dr. Fields:persistent gastric ulcers due to NSAIDs. No need for surveillance as she has continued to have EGDs noting NSAID-induced ulcers and continues to use NSAIDs.   . Small bowel capsule endoscopy  October 2010   Dr. Barnabas Lister [place]--> normal.   . TUBAL LIGATION      Current Outpatient Medications  Medication Sig Dispense Refill  . ARIPiprazole (ABILIFY) 15 MG tablet Take 15 mg by mouth daily.    Marland Kitchen aspirin EC 81 MG tablet Take 81 mg by mouth daily. Swallow whole.    . Azelastine HCl 137 MCG/SPRAY SOLN Place 1-2 sprays into both nostrils daily as needed.    . clopidogrel (PLAVIX) 75 MG tablet Take 1 tablet (75 mg total) by mouth daily. 30 tablet 3  . DULoxetine (CYMBALTA) 60 MG capsule Take 60 mg by mouth daily.    Marland Kitchen levocetirizine (XYZAL) 5 MG tablet Take 5 mg by mouth daily.    Marland Kitchen lisinopril (PRINIVIL,ZESTRIL) 20 MG tablet Take 20  mg by mouth daily.     . methocarbamol (ROBAXIN) 500 MG tablet As needed    . pantoprazole (PROTONIX) 40 MG tablet TAKE ONE TABLET BY MOUTH TWICE DAILY (Patient taking differently: Take 40 mg by mouth 2 (two) times daily. ) 180 tablet 3  . rosuvastatin (CRESTOR) 20 MG tablet Take 1 tablet (20 mg total) by mouth at bedtime. 30 tablet 3  . traZODone (DESYREL) 100 MG tablet Take 100 mg by mouth at bedtime.     No current facility-administered medications for this visit.    Allergies as of 10/07/2020  . (No Known Allergies)    Family History  Problem Relation Age of Onset  . Colon cancer Paternal Aunt        greater >60  .  Lung cancer Father   . Heart disease Father   . Diverticulitis Sister   . Anxiety disorder Sister   . Depression Sister   . Liver disease Neg Hx   . Celiac disease Neg Hx     Social History   Socioeconomic History  . Marital status: Divorced    Spouse name: Not on file  . Number of children: 2  . Years of education: Not on file  . Highest education level: Not on file  Occupational History  . Not on file  Tobacco Use  . Smoking status: Current Every Day Smoker    Packs/day: 0.50    Years: 38.00    Pack years: 19.00    Types: Cigarettes  . Smokeless tobacco: Never Used  . Tobacco comment: "trying to quit"   Substance and Sexual Activity  . Alcohol use: No    Alcohol/week: 0.0 standard drinks  . Drug use: No  . Sexual activity: Not Currently  Other Topics Concern  . Not on file  Social History Narrative  . Not on file   Social Determinants of Health   Financial Resource Strain:   . Difficulty of Paying Living Expenses: Not on file  Food Insecurity:   . Worried About Charity fundraiser in the Last Year: Not on file  . Ran Out of Food in the Last Year: Not on file  Transportation Needs:   . Lack of Transportation (Medical): Not on file  . Lack of Transportation (Non-Medical): Not on file  Physical Activity:   . Days of Exercise per Week: Not on file  . Minutes of Exercise per Session: Not on file  Stress:   . Feeling of Stress : Not on file  Social Connections:   . Frequency of Communication with Friends and Family: Not on file  . Frequency of Social Gatherings with Friends and Family: Not on file  . Attends Religious Services: Not on file  . Active Member of Clubs or Organizations: Not on file  . Attends Archivist Meetings: Not on file  . Marital Status: Not on file    Subjective: Review of Systems  Constitutional: Negative for chills and fever.  HENT: Negative for congestion and hearing loss.   Eyes: Negative for blurred vision and double  vision.  Respiratory: Negative for cough and shortness of breath.   Cardiovascular: Negative for chest pain and palpitations.  Gastrointestinal: Positive for heartburn. Negative for abdominal pain, blood in stool, constipation, diarrhea, melena and vomiting.       Dysphagia  Genitourinary: Negative for dysuria and urgency.  Musculoskeletal: Negative for joint pain and myalgias.  Skin: Negative for itching and rash.  Neurological: Negative for dizziness and headaches.  Psychiatric/Behavioral: Negative for depression. The patient is not nervous/anxious.      Objective: BP (!) 146/74   Pulse 69   Temp 97.8 F (36.6 C) (Oral)   Ht 5\' 3"  (1.6 m)   Wt 164 lb 3.2 oz (74.5 kg)   BMI 29.09 kg/m  Physical Exam Constitutional:      Appearance: Normal appearance.  HENT:     Head: Normocephalic and atraumatic.  Eyes:     Extraocular Movements: Extraocular movements intact.     Conjunctiva/sclera: Conjunctivae normal.  Cardiovascular:     Rate and Rhythm: Normal rate and regular rhythm.  Pulmonary:     Effort: Pulmonary effort is normal.     Breath sounds: Normal breath sounds.  Abdominal:     General: Bowel sounds are normal.     Palpations: Abdomen is soft.  Musculoskeletal:        General: No swelling. Normal range of motion.     Cervical back: Normal range of motion and neck supple.  Skin:    General: Skin is warm and dry.     Coloration: Skin is not jaundiced.  Neurological:     General: No focal deficit present.     Mental Status: She is alert and oriented to person, place, and time.  Psychiatric:        Mood and Affect: Mood normal.        Behavior: Behavior normal.      Assessment: *GERD-mildly improved on Protonix 40 mg twice daily *Dysphagia-new, worsening *Colon cancer screening  Plan: In regards to patient's dysphagia, I discussed in depth with her today that this could be related to her recent stroke.  However given her long history of chronic reflux, Will  schedule for EGD to evaluate for peptic ulcer disease, esophagitis, gastritis, H. Pylori, duodenitis, or other. Will also evaluate for esophageal stricture, Schatzki's ring, esophageal web or other.   The risks including infection, bleed, or perforation as well as benefits, limitations, alternatives and imponderables have been reviewed with the patient. Potential for esophageal dilation, biopsy, etc. have also been reviewed.  Questions have been answered. All parties agreeable.  Continue on Protonix 40 mg twice daily, we may change to [place] pending endoscopic findings.  Colonoscopy recall 2024.  10/07/2020 10:54 AM   Disclaimer: This note was dictated with voice recognition software. Similar sounding words can inadvertently be transcribed and may not be corrected upon review.

## 2020-10-12 ENCOUNTER — Encounter: Payer: Self-pay | Admitting: Internal Medicine

## 2020-10-12 ENCOUNTER — Telehealth: Payer: Self-pay | Admitting: Internal Medicine

## 2020-10-12 NOTE — Telephone Encounter (Signed)
Called pt back and she informed me that she would like to have EGD done but does not want to have balloon dilation.  She is fearful of the balloon dilation.  She said that she is afraid that she might have a stroke.  Pt informed me that Dr. Bartolo Darter referred her to Dr. Merlene Laughter but she doesn't want to go.  Informed her to let Dr. Arliss Journey office know that information.  Pt voiced understanding.

## 2020-10-12 NOTE — Telephone Encounter (Signed)
Pt's caregiver called to say that patient does not want a balloon run down her throat and she wants her throat stretched. Then she said that patient will not go see Dr Merlene Laughter, but she will see Dr Bartolo Darter at [facility]. [phone]

## 2020-12-09 ENCOUNTER — Telehealth: Payer: Self-pay | Admitting: Internal Medicine

## 2020-12-09 DIAGNOSIS — R1310 Dysphagia, unspecified: Secondary | ICD-10-CM

## 2020-12-09 NOTE — Telephone Encounter (Signed)
I see 4 EGD's in past with no mention of dilation. I would recommend a BPE first as she is a relatively fresh stroke and on plavix.  If abnormal, we can re-visit EGD +- ED

## 2020-12-09 NOTE — Telephone Encounter (Signed)
[phone] michelle, patient caretaker, called asking when we were going to schedule patient to stretch her esophagus

## 2020-12-09 NOTE — Telephone Encounter (Signed)
I do not perform Maloney dilations.  Perhaps Dr. Gala Romney would be willing to perform patient's EGD with potential Maloney dilation if clinically indicated.  Patient may not need to see neurology at all if Dr. Gala Romney is also comfortable performing dilation while patient is on Plavix.  Thanks for taking a look at this patient for me Ronalee Belts.

## 2020-12-09 NOTE — Telephone Encounter (Signed)
Reviewed encounter form from [facility], pt needs EGD-/+maloney and hold Plavix for 5 days. Needed clearance from Dr. Merlene Laughter.   Called caregiver, pt wants EGD and her esophagus stretched but doesn't want balloon dilation because she is scared of having another stroke. She didn't see Dr. Merlene Laughter and doesn't want to go back to him. Caregiver said PCP is going to see about getting her appt to see another neurologist but PCP is out of office at this time because of a family emergency.  Dr. Abbey Chatters please advise if it's ok to schedule procedure or should she be seen by a neurologist first?

## 2020-12-10 NOTE — Addendum Note (Signed)
Addended by: Zara Council C on: 12/10/2020 10:01 AM   Modules accepted: Orders

## 2020-12-10 NOTE — Telephone Encounter (Signed)
BPE scheduled for 12/15/20 at 10:00am, arrive at 9:45am. NPO 3 hours prior to test.  Called and informed caregiver Sharyn Lull of BPE appt and Dr. Roseanne Kaufman recommendation.

## 2020-12-13 ENCOUNTER — Telehealth: Payer: Self-pay

## 2020-12-13 NOTE — Telephone Encounter (Signed)
Dr. Gala Romney is off today. Faxed stamped copy of order to [facility] radiology.

## 2020-12-13 NOTE — Telephone Encounter (Signed)
-----   Message from Laurita Quint sent at 12/13/2020  2:11 PM EST ----- Regarding: e-sign order Hello, this patient is scheduled to come in on 12/15/2020 for DG Barium Swallow.   Please e-sign order.  Thank you for your help with this.

## 2020-12-15 ENCOUNTER — Other Ambulatory Visit: Payer: Self-pay

## 2020-12-15 ENCOUNTER — Ambulatory Visit (HOSPITAL_COMMUNITY)
Admission: RE | Admit: 2020-12-15 | Discharge: 2020-12-15 | Disposition: A | Discharge status: Home / Self Care | Payer: Medicare HMO | Source: Ambulatory Visit | Attending: Internal Medicine | Admitting: Internal Medicine

## 2020-12-15 DIAGNOSIS — R1310 Dysphagia, unspecified: Secondary | ICD-10-CM | POA: Diagnosis present

## 2020-12-23 ENCOUNTER — Other Ambulatory Visit: Payer: Self-pay | Admitting: *Deleted

## 2020-12-23 DIAGNOSIS — R1310 Dysphagia, unspecified: Secondary | ICD-10-CM

## 2021-05-25 ENCOUNTER — Other Ambulatory Visit (HOSPITAL_COMMUNITY): Payer: Self-pay | Admitting: Emergency Medicine

## 2021-05-25 DIAGNOSIS — Z1231 Encounter for screening mammogram for malignant neoplasm of breast: Secondary | ICD-10-CM

## 2021-06-23 ENCOUNTER — Other Ambulatory Visit (HOSPITAL_BASED_OUTPATIENT_CLINIC_OR_DEPARTMENT_OTHER): Payer: Self-pay

## 2021-06-23 DIAGNOSIS — R0681 Apnea, not elsewhere classified: Secondary | ICD-10-CM

## 2021-06-23 DIAGNOSIS — G4710 Hypersomnia, unspecified: Secondary | ICD-10-CM

## 2022-06-22 ENCOUNTER — Encounter: Payer: Self-pay | Admitting: Internal Medicine

## 2022-07-20 NOTE — Progress Notes (Deleted)
GI Office Note    Referring Provider: Vidal Schwalbe, MD Primary Care Physician:  Vidal Schwalbe, MD Primary Gastroenterologist: Dr. Abbey Chatters  Date:  07/20/2022  ID:  Cynthia Munoz, DOB August 02, 1951, MRN [id number]   Chief Complaint   No chief complaint on file.   History of Present Illness  Cynthia Munoz is a 71 y.o. female with a history of stroke, anxiety, depression, COPD, dysphagia, GERD, and HTN*** presenting today with complaint of constipation***  EGD and colonoscopy in August 2014. Colonoscopy with normal terminal ileum, mild diverticulosis noted in the sigmoid colon, moderate size internal hemorrhoids.  EGD with 3 irregular shaped deep and clean-based ulcers ranging between 3 to 7 mm in size in the gastric antrum and pylorus, normal duodenum.  Stomach biopsy with benign ulcerated and reactive gastric mucosa.  Duodenal biopsies negative for celiac sprue.  Last EGD in June 2016: Persistent gastric ulcer/duodenitis due to continued NSAID use.  Recommendations were for no surveillance EGD given continued NSAID use and ongoing ulcers.  She was advised to stop all NSAID use and continue Protonix twice daily.  Last office visit October 2021.  Patient reported worsening dysphagia since her stroke in January/February 2021.  She mostly complained of solids and liquids with feeling like things were getting stuck in her esophagus.  She was placed on Protonix 40 mg twice daily with no relief of her symptoms.  She denied any epigastric pain.  She did state heartburn was improved with her Protonix, she denied any frequent NSAID use.  She was on aspirin and Plavix for stroke prevention.  She denied any melena or hematochezia or any changes in bowel habits.  She was scheduled for upper endoscopy to further evaluate her dysphagia the patient ultimately refused balloon dilation with endoscopy.  She was scheduled for a barium swallow.  BPE December 2021: Esophageal distention without mass or  stricture, barium tablet passed without obstruction/delay, evidence of age-related esophageal dysmotility without witnessed gastric esophageal reflux.  She was ultimately referred to speech therapy for evaluation.  Patient seen by PCP on 06/19/2022 with one of her complaints being constipation and was requesting Linzess.  She was given prescription for 145 mcg Linzess and abdominal x-ray was ordered.  She was previously advised to increase fiber and take fiber capsules 1-3 times daily with large glass of water and to take MiraLAX as well continue to report significant constipation with several days between bowel movements.  No report of KUB available.  Today:    Current Outpatient Medications  Medication Sig Dispense Refill   ARIPiprazole (ABILIFY) 15 MG tablet Take 15 mg by mouth daily.     aspirin EC 81 MG tablet Take 81 mg by mouth daily. Swallow whole.     Azelastine HCl 137 MCG/SPRAY SOLN Place 1-2 sprays into both nostrils daily as needed.     clopidogrel (PLAVIX) 75 MG tablet Take 1 tablet (75 mg total) by mouth daily. 30 tablet 3   DULoxetine (CYMBALTA) 60 MG capsule Take 60 mg by mouth daily.     levocetirizine (XYZAL) 5 MG tablet Take 5 mg by mouth daily.     lisinopril (PRINIVIL,ZESTRIL) 20 MG tablet Take 20 mg by mouth daily.      methocarbamol (ROBAXIN) 500 MG tablet As needed     pantoprazole (PROTONIX) 40 MG tablet TAKE ONE TABLET BY MOUTH TWICE DAILY (Patient taking differently: Take 40 mg by mouth 2 (two) times daily. ) 180 tablet 3   rosuvastatin (CRESTOR) 20 MG tablet  Take 1 tablet (20 mg total) by mouth at bedtime. 30 tablet 3   traZODone (DESYREL) 100 MG tablet Take 100 mg by mouth at bedtime.     No current facility-administered medications for this visit.    Past Medical History:  Diagnosis Date   Anxiety    COPD (chronic obstructive pulmonary disease) ([facility])    Depression    GERD (gastroesophageal reflux disease)    High cholesterol    Hypertension     Hypertension    Shortness of breath    Skin cancer, basal cell     Past Surgical History:  Procedure Laterality Date   BACK SURGERY     CHOLECYSTECTOMY     COLONOSCOPY  August 2010   Dr. Barnabas Lister [place]--> sessile polyp removed from the rectum into from the sigmoid colon. Pathology: Hyperplastic    COLONOSCOPY WITH ESOPHAGOGASTRODUODENOSCOPY (EGD) N/A 08/11/2013   Dr. Oneida Alar: 1. Normal mucosa in the terminal ileum. 2. Mild diveticulosois was noted inthe sigmoid colon.  EGD: 1. abd pain due to PUD 2. No obvious source for diarrhea identified. Negative sprue on duodenal biopsy   ESOPHAGOGASTRODUODENOSCOPY  August 2013   Dr. Ria Bush Hou--> protocol.c reflux esophagitis. Nonbleeding gastric ulcers. Hiatal hernia. Biopsies negative for H. pylori or Barrett's esophagus. Followup EGD recommended but not done.   ESOPHAGOGASTRODUODENOSCOPY N/A 01/06/2014   Dr. Oneida Alar: 1. Small ulcer was found at the pylorus 2. Medium sized ulcer was found in the prepyloric region of the stomach  3. Mild duodenitis. in the second portion of the duodenum. Needs surveillance EGD but did not follow-up until 2016. Neg H.pylori   ESOPHAGOGASTRODUODENOSCOPY N/A 06/03/2015   Dr. Fields:persistent gastric ulcers due to NSAIDs. No need for surveillance as she has continued to have EGDs noting NSAID-induced ulcers and continues to use NSAIDs.    Small bowel capsule endoscopy  October 2010   Dr. Barnabas Lister [place]--> normal.    TUBAL LIGATION      Family History  Problem Relation Age of Onset   Colon cancer Paternal Aunt        greater >60   Lung cancer Father    Heart disease Father    Diverticulitis Sister    Anxiety disorder Sister    Depression Sister    Liver disease Neg Hx    Celiac disease Neg Hx     Allergies as of 07/25/2022   (No Known Allergies)    Social History   Socioeconomic History   Marital status: Divorced    Spouse name: Not on file   Number of children: 2   Years of education: Not on file   Highest  education level: Not on file  Occupational History   Not on file  Tobacco Use   Smoking status: Every Day    Packs/day: 0.50    Years: 38.00    Total pack years: 19.00    Types: Cigarettes   Smokeless tobacco: Never   Tobacco comments:    "trying to quit"   Substance and Sexual Activity   Alcohol use: No    Alcohol/week: 0.0 standard drinks of alcohol   Drug use: No   Sexual activity: Not Currently  Other Topics Concern   Not on file  Social History Narrative   Not on file   Social Determinants of Health   Financial Resource Strain: Not on file  Food Insecurity: Not on file  Transportation Needs: Not on file  Physical Activity: Not on file  Stress: Not on file  Social Connections:  Not on file     Review of Systems   Gen: Denies fever, chills, anorexia. Denies fatigue, weakness, weight loss.  CV: Denies chest pain, palpitations, syncope, peripheral edema, and claudication. Resp: Denies dyspnea at rest, cough, wheezing, coughing up blood, and pleurisy. GI: See HPI Derm: Denies rash, itching, dry skin Psych: Denies depression, anxiety, memory loss, confusion. No homicidal or suicidal ideation.  Heme: Denies bruising, bleeding, and enlarged lymph nodes.   Physical Exam   There were no vitals taken for this visit.  General:   Alert and oriented. No distress noted. Pleasant and cooperative.  Head:  Normocephalic and atraumatic. Eyes:  Conjuctiva clear without scleral icterus. Mouth:  Oral mucosa pink and moist. Good dentition. No lesions. Lungs:  Clear to auscultation bilaterally. No wheezes, rales, or rhonchi. No distress.  Heart:  S1, S2 present without murmurs appreciated.  Abdomen:  +BS, soft, non-tender and non-distended. No rebound or guarding. No HSM or masses noted. Rectal: *** Msk:  Symmetrical without gross deformities. Normal posture. Extremities:  Without edema. Neurologic:  Alert and  oriented x4 Psych:  Alert and cooperative. Normal mood and  affect.   Assessment  RILLA BUCKMAN is a 71 y.o. female with a history of stroke, anxiety, depression, COPD, dysphagia, GERD, and HTN*** presenting today with complaint of constipation***  Constipation:    PLAN   ***     Cynthia Night, MSN, FNP-BC, AGACNP-BC [facility]

## 2022-07-25 ENCOUNTER — Ambulatory Visit: Payer: Medicare HMO | Admitting: Gastroenterology

## 2022-09-21 ENCOUNTER — Other Ambulatory Visit (HOSPITAL_COMMUNITY): Payer: Self-pay | Admitting: Emergency Medicine

## 2022-09-21 DIAGNOSIS — Z1231 Encounter for screening mammogram for malignant neoplasm of breast: Secondary | ICD-10-CM

## 2022-10-04 ENCOUNTER — Ambulatory Visit (HOSPITAL_COMMUNITY): Payer: Medicare HMO

## 2022-10-18 ENCOUNTER — Ambulatory Visit (HOSPITAL_COMMUNITY)
Admission: RE | Admit: 2022-10-18 | Discharge: 2022-10-18 | Disposition: A | Discharge status: Home / Self Care | Payer: Medicare HMO | Source: Ambulatory Visit | Attending: Emergency Medicine | Admitting: Emergency Medicine

## 2022-10-18 DIAGNOSIS — Z1231 Encounter for screening mammogram for malignant neoplasm of breast: Secondary | ICD-10-CM | POA: Diagnosis present

## 2022-11-14 ENCOUNTER — Encounter: Payer: Self-pay | Admitting: *Deleted

## 2023-05-01 ENCOUNTER — Encounter: Payer: Self-pay | Admitting: *Deleted

## 2023-07-26 ENCOUNTER — Encounter: Payer: Self-pay | Admitting: Nurse Practitioner

## 2023-07-27 ENCOUNTER — Other Ambulatory Visit (HOSPITAL_COMMUNITY): Payer: Self-pay | Admitting: Nurse Practitioner

## 2023-07-27 DIAGNOSIS — M25562 Pain in left knee: Secondary | ICD-10-CM

## 2023-08-11 ENCOUNTER — Encounter (HOSPITAL_COMMUNITY): Payer: Self-pay

## 2023-08-11 ENCOUNTER — Ambulatory Visit (HOSPITAL_COMMUNITY): Payer: Medicare HMO

## 2023-10-04 ENCOUNTER — Other Ambulatory Visit (HOSPITAL_COMMUNITY): Payer: Self-pay | Admitting: Nurse Practitioner

## 2023-10-04 DIAGNOSIS — Z1231 Encounter for screening mammogram for malignant neoplasm of breast: Secondary | ICD-10-CM

## 2023-10-22 ENCOUNTER — Ambulatory Visit (HOSPITAL_COMMUNITY): Payer: Medicare HMO

## 2023-10-31 ENCOUNTER — Inpatient Hospital Stay (HOSPITAL_COMMUNITY): Admission: RE | Admit: 2023-10-31 | Payer: Medicare HMO | Source: Ambulatory Visit

## 2024-01-30 ENCOUNTER — Ambulatory Visit: Payer: Medicare HMO | Admitting: Orthopaedic Surgery

## 2024-02-06 ENCOUNTER — Ambulatory Visit: Payer: Medicare HMO | Admitting: Orthopaedic Surgery

## 2024-02-13 ENCOUNTER — Ambulatory Visit: Payer: Medicare HMO | Admitting: Orthopaedic Surgery

## 2024-03-05 ENCOUNTER — Encounter: Payer: Self-pay | Admitting: Orthopaedic Surgery

## 2024-03-05 ENCOUNTER — Other Ambulatory Visit (INDEPENDENT_AMBULATORY_CARE_PROVIDER_SITE_OTHER)

## 2024-03-05 ENCOUNTER — Ambulatory Visit (INDEPENDENT_AMBULATORY_CARE_PROVIDER_SITE_OTHER): Payer: Medicare HMO | Admitting: Orthopaedic Surgery

## 2024-03-05 VITALS — BP 137/79 | HR 93 | Ht 63.0 in | Wt 135.0 lb

## 2024-03-05 DIAGNOSIS — M25562 Pain in left knee: Secondary | ICD-10-CM

## 2024-03-05 DIAGNOSIS — G8929 Other chronic pain: Secondary | ICD-10-CM

## 2024-03-05 NOTE — Progress Notes (Signed)
 Subjective:    Patient ID: Cynthia Munoz, female    DOB: 15-Jun-1951, 73 y.o.   MRN: [id number]  HPI She has had left knee pain for some time.  She has been seen at [facility].  I have reviewed their notes.  She has not had recent X-rays.  Her knee gives way and pops but does not swell much.  She has no redness, no numbness.  She takes Tylenol.  She is on Plavix.  She has no trauma.     Review of Systems  Constitutional:  Positive for activity change.  Musculoskeletal:  Positive for arthralgias, gait problem, joint swelling and myalgias.  All other systems reviewed and are negative. For Review of Systems, all other systems reviewed and are negative.  The following is a summary of the past history medically, past history surgically, known current medicines, social history and family history.  This information is gathered electronically by the computer from prior information and documentation.  I review this each visit and have found including this information at this point in the chart is beneficial and informative.   Past Medical History:  Diagnosis Date   Anxiety    COPD (chronic obstructive pulmonary disease) (HCC)    Depression    GERD (gastroesophageal reflux disease)    High cholesterol    Hypertension    Hypertension    Shortness of breath    Skin cancer, basal cell     Past Surgical History:  Procedure Laterality Date   BACK SURGERY     CHOLECYSTECTOMY     COLONOSCOPY  August 2010   Dr. Ree Kida Spainhour--> sessile polyp removed from the rectum into from the sigmoid colon. Pathology: Hyperplastic    COLONOSCOPY WITH ESOPHAGOGASTRODUODENOSCOPY (EGD) N/A 08/11/2013   Dr. Darrick Penna: 1. Normal mucosa in the terminal ileum. 2. Mild diveticulosois was noted inthe sigmoid colon.  EGD: 1. abd pain due to PUD 2. No obvious source for diarrhea identified. Negative sprue on duodenal biopsy   ESOPHAGOGASTRODUODENOSCOPY  August 2013   Dr. Anette Riedel Hou--> protocol.c reflux  esophagitis. Nonbleeding gastric ulcers. Hiatal hernia. Biopsies negative for H. pylori or Barrett's esophagus. Followup EGD recommended but not done.   ESOPHAGOGASTRODUODENOSCOPY N/A 01/06/2014   Dr. Darrick Penna: 1. Small ulcer was found at the pylorus 2. Medium sized ulcer was found in the prepyloric region of the stomach  3. Mild duodenitis. in the second portion of the duodenum. Needs surveillance EGD but did not follow-up until 2016. Neg H.pylori   ESOPHAGOGASTRODUODENOSCOPY N/A 06/03/2015   Dr. Fields:persistent gastric ulcers due to NSAIDs. No need for surveillance as she has continued to have EGDs noting NSAID-induced ulcers and continues to use NSAIDs.    Small bowel capsule endoscopy  October 2010   Dr. Ree Kida Spainhour--> normal.    TUBAL LIGATION      Current Outpatient Medications on File Prior to Visit  Medication Sig Dispense Refill   amLODipine (NORVASC) 10 MG tablet Take 10 mg by mouth daily.     ARIPiprazole (ABILIFY) 15 MG tablet Take 15 mg by mouth daily.     aspirin EC 81 MG tablet Take 81 mg by mouth daily. Swallow whole.     Azelastine HCl 137 MCG/SPRAY SOLN Place 1-2 sprays into both nostrils daily as needed.     clopidogrel (PLAVIX) 75 MG tablet Take 1 tablet (75 mg total) by mouth daily. 30 tablet 3   doxepin (SINEQUAN) 10 MG capsule 1 capsule at bedtime Orally Once a day for  90 days     DULoxetine (CYMBALTA) 60 MG capsule Take 60 mg by mouth daily.     lisinopril (PRINIVIL,ZESTRIL) 20 MG tablet Take 20 mg by mouth daily.      metFORMIN (GLUCOPHAGE) 500 MG tablet Take 500 mg by mouth 3 (three) times daily.     pantoprazole (PROTONIX) 40 MG tablet TAKE ONE TABLET BY MOUTH TWICE DAILY (Patient taking differently: Take 40 mg by mouth 2 (two) times daily.) 180 tablet 3   rosuvastatin (CRESTOR) 20 MG tablet Take 1 tablet (20 mg total) by mouth at bedtime. 30 tablet 3   levocetirizine (XYZAL) 5 MG tablet Take 5 mg by mouth daily. (Patient not taking: Reported on 03/05/2024)     No  current facility-administered medications on file prior to visit.    Social History   Socioeconomic History   Marital status: Divorced    Spouse name: Not on file   Number of children: 2   Years of education: Not on file   Highest education level: Not on file  Occupational History   Not on file  Tobacco Use   Smoking status: Every Day    Current packs/day: 0.50    Average packs/day: 0.5 packs/day for 38.0 years (19.0 ttl pk-yrs)    Types: Cigarettes   Smokeless tobacco: Never   Tobacco comments:    "trying to quit"   Substance and Sexual Activity   Alcohol use: No    Alcohol/week: 0.0 standard drinks of alcohol   Drug use: No   Sexual activity: Not Currently  Other Topics Concern   Not on file  Social History Narrative   Not on file   Social Drivers of Health   Financial Resource Strain: Not on file  Food Insecurity: Not on file  Transportation Needs: Not on file  Physical Activity: Not on file  Stress: Not on file  Social Connections: Not on file  Intimate Partner Violence: Not on file    Family History  Problem Relation Age of Onset   Colon cancer Paternal Aunt        greater >60   Lung cancer Father    Heart disease Father    Diverticulitis Sister    Anxiety disorder Sister    Depression Sister    Liver disease Neg Hx    Celiac disease Neg Hx     BP 137/79   Pulse 93   Ht 5\' 3"  (1.6 m)   Wt 135 lb (61.2 kg)   BMI 23.91 kg/m   Body mass index is 23.91 kg/m.      Objective:   Physical Exam Vitals and nursing note reviewed. Exam conducted with a chaperone present.  Constitutional:      Appearance: She is well-developed.  HENT:     Head: Normocephalic and atraumatic.  Eyes:     Conjunctiva/sclera: Conjunctivae normal.     Pupils: Pupils are equal, round, and reactive to light.  Cardiovascular:     Rate and Rhythm: Normal rate and regular rhythm.  Pulmonary:     Effort: Pulmonary effort is normal.  Abdominal:     Palpations: Abdomen is  soft.  Musculoskeletal:     Cervical back: Normal range of motion and neck supple.       Legs:  Skin:    General: Skin is warm and dry.  Neurological:     Mental Status: She is alert and oriented to person, place, and time.     Cranial Nerves: No cranial nerve deficit.  Motor: No abnormal muscle tone.     Coordination: Coordination normal.     Deep Tendon Reflexes: Reflexes are normal and symmetric. Reflexes normal.  Psychiatric:        Behavior: Behavior normal.        Thought Content: Thought content normal.        Judgment: Judgment normal.   X-rays were done of the left knee reported separately.          Assessment & Plan:   Encounter Diagnosis  Name Primary?   Chronic pain of left knee Yes   PROCEDURE NOTE:  The patient requests injections of the left knee , verbal consent was obtained.  The left knee was prepped appropriately after time out was performed.   Sterile technique was observed and injection of 1 cc of DepoMedrol 40 mg with several cc's of plain xylocaine. Anesthesia was provided by ethyl chloride and a 20-gauge needle was used to inject the knee area. The injection was tolerated well.  A band aid dressing was applied.  The patient was advised to apply ice later today and tomorrow to the injection sight as needed.  I will see her in one month.  Consider MRI if not improved.  Call if any problem.  Precautions discussed.  Electronically Signed Darreld Mclean, MD 3/12/20259:05 AM

## 2024-03-13 ENCOUNTER — Telehealth: Payer: Self-pay | Admitting: Orthopaedic Surgery

## 2024-03-13 MED ORDER — HYDROCODONE-ACETAMINOPHEN 5-325 MG PO TABS: ORAL_TABLET | ORAL | 0 refills | Status: DC

## 2024-03-13 NOTE — Telephone Encounter (Signed)
 DR. Hilda Lias  Patient was seen on 03/05/24 and got an injection and she states the injection did help alittle bit and she has been taking Tylenol and it hasn't done nothing except keep her up at night.   Is there something that can be called in for her for the pain   Pharmacy:  [facility]

## 2024-04-02 ENCOUNTER — Ambulatory Visit: Admitting: Orthopaedic Surgery

## 2024-04-09 ENCOUNTER — Telehealth: Payer: Self-pay | Admitting: Orthopaedic Surgery

## 2024-04-09 ENCOUNTER — Ambulatory Visit: Admitting: Orthopaedic Surgery

## 2024-04-09 MED ORDER — HYDROCODONE-ACETAMINOPHEN 5-325 MG PO TABS: ORAL_TABLET | ORAL | 0 refills | Status: DC

## 2024-04-09 NOTE — Telephone Encounter (Signed)
 Dr. Vicente Graham pt - spoke w/the pt, rs her appt from today until 4/30.  She is requesting Hydrocodone be sent to [facility].

## 2024-04-23 ENCOUNTER — Telehealth: Payer: Self-pay | Admitting: Orthopaedic Surgery

## 2024-04-23 ENCOUNTER — Ambulatory Visit: Admitting: Orthopaedic Surgery

## 2024-04-23 NOTE — Telephone Encounter (Signed)
 Dr. Vicente Graham pt - pt had an appt today and 4/16, has rs both times, she has rs to 5/07, she is requesting a refill for Hydrocodone  5-325 to be sent to [facility].

## 2024-04-23 NOTE — Telephone Encounter (Signed)
 Called and left vm stating no RX refills until seen in office

## 2024-04-30 ENCOUNTER — Ambulatory Visit (INDEPENDENT_AMBULATORY_CARE_PROVIDER_SITE_OTHER): Admitting: Orthopaedic Surgery

## 2024-04-30 ENCOUNTER — Encounter: Payer: Self-pay | Admitting: Orthopaedic Surgery

## 2024-04-30 VITALS — BP 135/80 | HR 78 | Ht 63.0 in | Wt 135.0 lb

## 2024-04-30 DIAGNOSIS — E663 Overweight: Secondary | ICD-10-CM | POA: Insufficient documentation

## 2024-04-30 DIAGNOSIS — G8929 Other chronic pain: Secondary | ICD-10-CM

## 2024-04-30 DIAGNOSIS — M25562 Pain in left knee: Secondary | ICD-10-CM | POA: Diagnosis not present

## 2024-04-30 DIAGNOSIS — R400 Somnolence: Secondary | ICD-10-CM | POA: Insufficient documentation

## 2024-04-30 DIAGNOSIS — F419 Anxiety disorder, unspecified: Secondary | ICD-10-CM | POA: Insufficient documentation

## 2024-04-30 DIAGNOSIS — G4700 Insomnia, unspecified: Secondary | ICD-10-CM | POA: Insufficient documentation

## 2024-04-30 DIAGNOSIS — Z720 Tobacco use: Secondary | ICD-10-CM | POA: Insufficient documentation

## 2024-04-30 MED ORDER — METHYLPREDNISOLONE ACETATE 40 MG/ML IJ SUSP
40.0000 mg | Freq: Once | INTRAMUSCULAR | Status: AC
  Administered 2024-04-30: 40 mg via INTRA_ARTICULAR

## 2024-04-30 NOTE — Addendum Note (Signed)
 Addended byArla Lab on: 04/30/2024 02:46 PM   Modules accepted: Orders

## 2024-04-30 NOTE — Progress Notes (Signed)
 PROCEDURE NOTE:  The patient requests injections of the left knee , verbal consent was obtained.  The left knee was prepped appropriately after time out was performed.   Sterile technique was observed and injection of 1 cc of DepoMedrol 40 mg with several cc's of plain xylocaine . Anesthesia was provided by ethyl chloride and a 20-gauge needle was used to inject the knee area. The injection was tolerated well.  A band aid dressing was applied.  The patient was advised to apply ice later today and tomorrow to the injection sight as needed.  Encounter Diagnosis  Name Primary?   Chronic pain of left knee Yes   Return in four weeks.  Call if any problem.  Precautions discussed.  Electronically Signed Pleasant Brilliant, MD 5/7/20252:30 PM

## 2024-05-01 ENCOUNTER — Telehealth: Payer: Self-pay | Admitting: Orthopaedic Surgery

## 2024-05-01 MED ORDER — HYDROCODONE-ACETAMINOPHEN 5-325 MG PO TABS: ORAL_TABLET | ORAL | 0 refills | Status: DC

## 2024-05-01 NOTE — Telephone Encounter (Signed)
 Dr. Vicente Graham pt - spoke w/the pt, she stated she was seen yesterday and forgot to request a refill for Hydrocodone  5-325.  She would like this sent to [facility]

## 2024-05-13 ENCOUNTER — Telehealth: Payer: Self-pay | Admitting: Orthopaedic Surgery

## 2024-05-22 ENCOUNTER — Other Ambulatory Visit (HOSPITAL_COMMUNITY): Payer: Self-pay | Admitting: Family Medicine

## 2024-05-22 DIAGNOSIS — Z1231 Encounter for screening mammogram for malignant neoplasm of breast: Secondary | ICD-10-CM

## 2024-05-26 ENCOUNTER — Telehealth: Payer: Self-pay | Admitting: Orthopaedic Surgery

## 2024-05-28 ENCOUNTER — Ambulatory Visit: Admitting: Orthopaedic Surgery

## 2024-06-04 ENCOUNTER — Encounter: Payer: Self-pay | Admitting: Orthopaedic Surgery

## 2024-06-04 ENCOUNTER — Ambulatory Visit (INDEPENDENT_AMBULATORY_CARE_PROVIDER_SITE_OTHER): Admitting: Orthopaedic Surgery

## 2024-06-04 DIAGNOSIS — G8929 Other chronic pain: Secondary | ICD-10-CM

## 2024-06-04 DIAGNOSIS — F17210 Nicotine dependence, cigarettes, uncomplicated: Secondary | ICD-10-CM

## 2024-06-04 DIAGNOSIS — M25562 Pain in left knee: Secondary | ICD-10-CM

## 2024-06-04 MED ORDER — METHYLPREDNISOLONE ACETATE 40 MG/ML IJ SUSP
40.0000 mg | Freq: Once | INTRAMUSCULAR | Status: AC
  Administered 2024-06-04: 40 mg via INTRA_ARTICULAR

## 2024-06-04 NOTE — Patient Instructions (Signed)
 Steps to Quit Smoking Smoking tobacco is the leading cause of preventable death. It can affect almost every organ in the body. Smoking puts you and people around you at risk for many serious, long-lasting (chronic) diseases. Quitting smoking can be hard, but it is one of the best things that you can do for your health. It is never too late to quit. Do not give up if you cannot quit the first time. Some people need to try many times to quit. Do your best to stick to your quit plan, and talk with your doctor if you have any questions or concerns. How do I get ready to quit? Pick a date to quit. Set a date within the next 2 weeks to give you time to prepare. Write down the reasons why you are quitting. Keep this list in places where you will see it often. Tell your family, friends, and co-workers that you are quitting. Their support is important. Talk with your doctor about the choices that may help you quit. Find out if your health insurance will pay for these treatments. Know the people, places, things, and activities that make you want to smoke (triggers). Avoid them. What first steps can I take to quit smoking? Throw away all cigarettes at home, at work, and in your car. Throw away the things that you use when you smoke, such as ashtrays and lighters. Clean your car. Empty the ashtray. Clean your home, including curtains and carpets. What can I do to help me quit smoking? Talk with your doctor about taking medicines and seeing a counselor. You are more likely to succeed when you do both. If you are pregnant or breastfeeding: Talk with your doctor about counseling or other ways to quit smoking. Do not take medicine to help you quit smoking unless your doctor tells you to. Quit right away Quit smoking completely, instead of slowly cutting back on how much you smoke over a period of time. Stopping smoking right away may be more successful than slowly quitting. Go to counseling. In-person is best  if this is an option. You are more likely to quit if you go to counseling sessions regularly. Take medicine You may take medicines to help you quit. Some medicines need a prescription, and some you can buy over-the-counter. Some medicines may contain a drug called nicotine to replace the nicotine in cigarettes. Medicines may: Help you stop having the desire to smoke (cravings). Help to stop the problems that come when you stop smoking (withdrawal symptoms). Your doctor may ask you to use: Nicotine patches, gum, or lozenges. Nicotine inhalers or sprays. Non-nicotine medicine that you take by mouth. Find resources Find resources and other ways to help you quit smoking and remain smoke-free after you quit. They include: Online chats with a Veterinary surgeon. Phone quitlines. Printed Materials engineer. Support groups or group counseling. Text messaging programs. Mobile phone apps. Use apps on your mobile phone or tablet that can help you stick to your quit plan. Examples of free services include Quit Guide from the CDC and smokefree.gov  What can I do to make it easier to quit?  Talk to your family and friends. Ask them to support and encourage you. Call a phone quitline, such as 1-800-QUIT-NOW, reach out to support groups, or work with a Veterinary surgeon. Ask people who smoke to not smoke around you. Avoid places that make you want to smoke, such as: Bars. Parties. Smoke-break areas at work. Spend time with people who do not smoke. Lower  the stress in your life. Stress can make you want to smoke. Try these things to lower stress: Getting regular exercise. Doing deep-breathing exercises. Doing yoga. Meditating. What benefits will I see if I quit smoking? Over time, you may have: A better sense of smell and taste. Less coughing and sore throat. A slower heart rate. Lower blood pressure. Clearer skin. Better breathing. Fewer sick days. Summary Quitting smoking can be hard, but it is one of  the best things that you can do for your health. Do not give up if you cannot quit the first time. Some people need to try many times to quit. When you decide to quit smoking, make a plan to help you succeed. Quit smoking right away, not slowly over a period of time. When you start quitting, get help and support to keep you smoke-free. This information is not intended to replace advice given to you by your health care provider. Make sure you discuss any questions you have with your health care provider. Document Revised: 12/02/2021 Document Reviewed: 12/02/2021 Elsevier Patient Education  2024 [employer].

## 2024-06-04 NOTE — Addendum Note (Signed)
 Addended by: [place] Snow T on: 06/04/2024 11:55 AM   Modules accepted: Orders

## 2024-06-04 NOTE — Progress Notes (Signed)
 PROCEDURE NOTE:  The patient requests injections of the left knee , verbal consent was obtained.  The left knee was prepped appropriately after time out was performed.   Sterile technique was observed and injection of 1 cc of DepoMedrol 40 mg with several cc's of plain xylocaine . Anesthesia was provided by ethyl chloride and a 20-gauge needle was used to inject the knee area. The injection was tolerated well.  A band aid dressing was applied.  The patient was advised to apply ice later today and tomorrow to the injection sight as needed.  Encounter Diagnoses  Name Primary?   Chronic pain of left knee Yes   Nicotine dependence, cigarettes, uncomplicated     Return in one month.  Call if any problem.  Precautions discussed.  Electronically Signed Pleasant Brilliant, MD 6/11/20259:41 AM

## 2024-06-09 ENCOUNTER — Telehealth: Payer: Self-pay | Admitting: Orthopaedic Surgery

## 2024-06-20 ENCOUNTER — Telehealth: Payer: Self-pay | Admitting: Orthopaedic Surgery

## 2024-07-01 ENCOUNTER — Other Ambulatory Visit: Payer: Self-pay | Admitting: Orthopaedic Surgery

## 2024-07-02 ENCOUNTER — Telehealth: Payer: Self-pay | Admitting: Orthopaedic Surgery

## 2024-07-02 ENCOUNTER — Ambulatory Visit: Admitting: Orthopaedic Surgery

## 2024-07-02 MED ORDER — HYDROCODONE-ACETAMINOPHEN 5-325 MG PO TABS: 1.0000 | ORAL_TABLET | Freq: Four times a day (QID) | ORAL | 0 refills | Status: DC | PRN

## 2024-07-02 NOTE — Telephone Encounter (Signed)
 Dr. Janae pt - spoke w/the pt, she had an appointment for today, but is sick.  She rescheduled for 07/09/24.  She is requesting something for arthritis to be sent to [facility] in [place].

## 2024-07-09 ENCOUNTER — Telehealth: Payer: Self-pay | Admitting: Orthopaedic Surgery

## 2024-07-09 ENCOUNTER — Ambulatory Visit: Admitting: Orthopaedic Surgery

## 2024-07-11 ENCOUNTER — Telehealth: Payer: Self-pay | Admitting: Orthopaedic Surgery

## 2024-07-11 NOTE — Telephone Encounter (Signed)
 Dr. Janae pt - pt lvm stating her knees are hurting and that the injections are not helping, she wants to know what to do.  [phone]

## 2024-07-11 NOTE — Telephone Encounter (Signed)
 She needs to keep appointment for next week  To dr brenna for further

## 2024-07-16 ENCOUNTER — Ambulatory Visit: Admitting: Orthopaedic Surgery

## 2024-07-16 ENCOUNTER — Telehealth: Payer: Self-pay | Admitting: Orthopaedic Surgery

## 2024-07-22 ENCOUNTER — Telehealth: Payer: Self-pay | Admitting: Orthopaedic Surgery

## 2024-07-23 ENCOUNTER — Telehealth: Payer: Self-pay | Admitting: Orthopaedic Surgery

## 2024-07-23 NOTE — Telephone Encounter (Signed)
 Pt called requesting pain meds for knee pains. Please send to [facility]. Pt phone number is [phone].

## 2024-07-29 ENCOUNTER — Telehealth: Payer: Self-pay | Admitting: Orthopaedic Surgery

## 2024-08-14 ENCOUNTER — Telehealth: Payer: Self-pay | Admitting: Orthopaedic Surgery

## 2024-08-14 MED ORDER — HYDROCODONE-ACETAMINOPHEN 5-325 MG PO TABS: 1.0000 | ORAL_TABLET | Freq: Four times a day (QID) | ORAL | 0 refills | Status: DC | PRN

## 2024-08-14 NOTE — Telephone Encounter (Signed)
I called her to advise. She voiced understanding.  

## 2024-08-14 NOTE — Telephone Encounter (Signed)
 Dr. Janae pt - spoke w/the pt, she is requesting a refill for Hydrocodone , but she wants 10mg , she stated the 5mg  is not helping.  She uses [facility]

## 2024-08-15 ENCOUNTER — Encounter: Payer: Self-pay | Admitting: Radiology

## 2024-08-19 ENCOUNTER — Telehealth: Payer: Self-pay | Admitting: Orthopaedic Surgery

## 2024-08-20 ENCOUNTER — Telehealth: Payer: Self-pay | Admitting: Orthopaedic Surgery

## 2024-08-20 NOTE — Telephone Encounter (Signed)
 Dr. Janae pt - pt lvm after 7pm last night stating her knee is hurting, she couldn't take much more of this, the pain meds aren't helping.  I called her this morning and offered to schedule her to come in and she stated she didn't want to come in.  She wants to know if Dr. MARLA can send something in for arthritis to [facility].

## 2024-08-21 ENCOUNTER — Telehealth: Payer: Self-pay | Admitting: Orthopaedic Surgery

## 2024-08-21 MED ORDER — NAPROXEN 500 MG PO TABS: 500.0000 mg | ORAL_TABLET | Freq: Two times a day (BID) | ORAL | 5 refills | Status: AC

## 2024-08-21 NOTE — Telephone Encounter (Signed)
 Called and left message for pt to return my call if she would like to have the MRI

## 2024-08-21 NOTE — Addendum Note (Signed)
 Addended by: BRENNA NORLEEN ORN on: 08/21/2024 08:40 AM   Modules accepted: Orders

## 2024-08-21 NOTE — Telephone Encounter (Signed)
 Returned the pt's call, lvm letting her know her meds were sent in on 08/19/24

## 2024-08-26 ENCOUNTER — Telehealth: Payer: Self-pay | Admitting: Orthopaedic Surgery

## 2024-08-27 ENCOUNTER — Other Ambulatory Visit: Payer: Self-pay | Admitting: Orthopaedic Surgery

## 2024-08-27 ENCOUNTER — Telehealth: Payer: Self-pay | Admitting: Orthopaedic Surgery

## 2024-08-27 DIAGNOSIS — G8929 Other chronic pain: Secondary | ICD-10-CM

## 2024-08-27 NOTE — Telephone Encounter (Signed)
 Dr. Janae pt - spoke w/the pt, she is requesting that Dr. MARLA order her a MRI for her lt knee.  She stated it hurst all the time and never eases up.  [phone]

## 2024-09-05 ENCOUNTER — Telehealth: Payer: Self-pay | Admitting: Orthopaedic Surgery

## 2024-09-12 ENCOUNTER — Ambulatory Visit (HOSPITAL_COMMUNITY): Admission: RE | Admit: 2024-09-12 | Source: Ambulatory Visit

## 2024-09-24 ENCOUNTER — Telehealth: Payer: Self-pay | Admitting: Orthopaedic Surgery

## 2024-09-26 ENCOUNTER — Ambulatory Visit (HOSPITAL_COMMUNITY)
Admission: RE | Admit: 2024-09-26 | Discharge: 2024-09-26 | Disposition: A | Discharge status: Home / Self Care | Source: Ambulatory Visit | Attending: Orthopaedic Surgery | Admitting: Orthopaedic Surgery

## 2024-09-26 DIAGNOSIS — G8929 Other chronic pain: Secondary | ICD-10-CM | POA: Diagnosis present

## 2024-09-26 DIAGNOSIS — M25562 Pain in left knee: Secondary | ICD-10-CM | POA: Insufficient documentation

## 2024-10-27 ENCOUNTER — Encounter: Payer: Self-pay | Admitting: Radiology
